# Patient Record
Sex: Female | Born: 1940 | Race: White | Hispanic: No | Marital: Married | Smoking: Never smoker
Health system: Southern US, Community
[De-identification: ages and names within clinical notes are randomized; demographics above are authoritative.]

## PROBLEM LIST (undated history)

## (undated) DIAGNOSIS — M810 Age-related osteoporosis without current pathological fracture: Secondary | ICD-10-CM

## (undated) DIAGNOSIS — I10 Essential (primary) hypertension: Secondary | ICD-10-CM

## (undated) DIAGNOSIS — E079 Disorder of thyroid, unspecified: Secondary | ICD-10-CM

## (undated) DIAGNOSIS — M1990 Unspecified osteoarthritis, unspecified site: Secondary | ICD-10-CM

## (undated) HISTORY — PX: SPINE SURGERY: SHX786

## (undated) HISTORY — DX: Essential (primary) hypertension: I10

## (undated) HISTORY — DX: Unspecified osteoarthritis, unspecified site: M19.90

## (undated) HISTORY — DX: Age-related osteoporosis without current pathological fracture: M81.0

## (undated) HISTORY — PX: FRACTURE SURGERY: SHX138

## (undated) HISTORY — DX: Disorder of thyroid, unspecified: E07.9

## (undated) HISTORY — PX: OTHER SURGICAL HISTORY: SHX169

---

## 2001-07-04 ENCOUNTER — Emergency Department (HOSPITAL_COMMUNITY): Admission: EM | Admit: 2001-07-04 | Discharge: 2001-07-04 | Payer: Self-pay | Admitting: Emergency Medicine

## 2002-10-08 ENCOUNTER — Emergency Department (HOSPITAL_COMMUNITY): Admission: EM | Admit: 2002-10-08 | Discharge: 2002-10-08 | Payer: Self-pay | Admitting: Emergency Medicine

## 2002-10-08 ENCOUNTER — Encounter: Payer: Self-pay | Admitting: *Deleted

## 2008-04-14 ENCOUNTER — Ambulatory Visit (HOSPITAL_COMMUNITY): Admission: RE | Admit: 2008-04-14 | Discharge: 2008-04-14 | Payer: Self-pay | Admitting: Neurosurgery

## 2008-05-05 ENCOUNTER — Inpatient Hospital Stay (HOSPITAL_COMMUNITY): Admission: RE | Admit: 2008-05-05 | Discharge: 2008-05-09 | Payer: Self-pay | Admitting: Neurosurgery

## 2010-08-11 ENCOUNTER — Encounter: Admission: RE | Admit: 2010-08-11 | Discharge: 2010-08-11 | Payer: Self-pay | Admitting: Neurosurgery

## 2010-11-24 ENCOUNTER — Other Ambulatory Visit: Payer: Self-pay | Admitting: Neurosurgery

## 2010-11-24 DIAGNOSIS — M549 Dorsalgia, unspecified: Secondary | ICD-10-CM

## 2010-11-24 DIAGNOSIS — IMO0002 Reserved for concepts with insufficient information to code with codable children: Secondary | ICD-10-CM

## 2010-11-29 ENCOUNTER — Other Ambulatory Visit: Payer: Self-pay

## 2010-12-06 ENCOUNTER — Ambulatory Visit
Admission: RE | Admit: 2010-12-06 | Discharge: 2010-12-06 | Disposition: A | Discharge status: Home / Self Care | Payer: Medicare Other | Source: Ambulatory Visit | Attending: Neurosurgery | Admitting: Neurosurgery

## 2010-12-06 ENCOUNTER — Ambulatory Visit
Admission: RE | Admit: 2010-12-06 | Discharge: 2010-12-06 | Disposition: A | Discharge status: Home / Self Care | Payer: BC Managed Care – PPO | Source: Ambulatory Visit | Attending: Neurosurgery | Admitting: Neurosurgery

## 2010-12-06 ENCOUNTER — Other Ambulatory Visit: Payer: Self-pay

## 2010-12-06 DIAGNOSIS — M549 Dorsalgia, unspecified: Secondary | ICD-10-CM

## 2010-12-06 DIAGNOSIS — IMO0002 Reserved for concepts with insufficient information to code with codable children: Secondary | ICD-10-CM

## 2010-12-27 ENCOUNTER — Other Ambulatory Visit: Payer: Self-pay | Admitting: Neurosurgery

## 2010-12-27 DIAGNOSIS — M479 Spondylosis, unspecified: Secondary | ICD-10-CM

## 2010-12-27 DIAGNOSIS — M549 Dorsalgia, unspecified: Secondary | ICD-10-CM

## 2010-12-27 DIAGNOSIS — M5410 Radiculopathy, site unspecified: Secondary | ICD-10-CM

## 2010-12-28 ENCOUNTER — Ambulatory Visit
Admission: RE | Admit: 2010-12-28 | Discharge: 2010-12-28 | Disposition: A | Discharge status: Home / Self Care | Payer: Medicare Other | Source: Ambulatory Visit | Attending: Neurosurgery | Admitting: Neurosurgery

## 2010-12-28 ENCOUNTER — Other Ambulatory Visit: Payer: Self-pay | Admitting: Neurosurgery

## 2010-12-28 DIAGNOSIS — M5410 Radiculopathy, site unspecified: Secondary | ICD-10-CM

## 2010-12-28 DIAGNOSIS — M479 Spondylosis, unspecified: Secondary | ICD-10-CM

## 2010-12-28 DIAGNOSIS — M549 Dorsalgia, unspecified: Secondary | ICD-10-CM

## 2011-01-11 ENCOUNTER — Ambulatory Visit
Admission: RE | Admit: 2011-01-11 | Discharge: 2011-01-11 | Disposition: A | Discharge status: Home / Self Care | Payer: Medicare Other | Source: Ambulatory Visit | Attending: Neurosurgery | Admitting: Neurosurgery

## 2011-01-11 DIAGNOSIS — M5410 Radiculopathy, site unspecified: Secondary | ICD-10-CM

## 2011-01-11 DIAGNOSIS — M549 Dorsalgia, unspecified: Secondary | ICD-10-CM

## 2011-01-11 DIAGNOSIS — M479 Spondylosis, unspecified: Secondary | ICD-10-CM

## 2011-02-13 ENCOUNTER — Other Ambulatory Visit: Payer: Self-pay | Admitting: Neurosurgery

## 2011-02-13 DIAGNOSIS — M549 Dorsalgia, unspecified: Secondary | ICD-10-CM

## 2011-02-13 DIAGNOSIS — M479 Spondylosis, unspecified: Secondary | ICD-10-CM

## 2011-02-13 DIAGNOSIS — M5410 Radiculopathy, site unspecified: Secondary | ICD-10-CM

## 2011-02-15 ENCOUNTER — Ambulatory Visit
Admission: RE | Admit: 2011-02-15 | Discharge: 2011-02-15 | Disposition: A | Discharge status: Home / Self Care | Payer: Medicare Other | Source: Ambulatory Visit | Attending: Neurosurgery | Admitting: Neurosurgery

## 2011-02-15 DIAGNOSIS — M479 Spondylosis, unspecified: Secondary | ICD-10-CM

## 2011-02-15 DIAGNOSIS — M549 Dorsalgia, unspecified: Secondary | ICD-10-CM

## 2011-02-15 DIAGNOSIS — M5410 Radiculopathy, site unspecified: Secondary | ICD-10-CM

## 2011-02-20 NOTE — Discharge Summary (Signed)
NAMEKENNETHIA, LYNES                ACCOUNT NO.:  [id number]   MEDICAL RECORD NO.:  [id number]          PATIENT TYPE:  INP   LOCATION:  3019                         FACILITY:  [facility]   PHYSICIAN:  Hewitt Shorts, M.D.DATE OF BIRTH:  [birth year]   DATE OF ADMISSION:  05/05/2008  DATE OF DISCHARGE:  05/09/2008                               DISCHARGE SUMMARY   ADMISSION HISTORY AND PHYSICAL EXAMINATION:  The patient is a 70-year-  old woman under the care of Dr. Hilda Lias.  She has had  difficulties with back pain into her lower extremities bilaterally.  She  had L2-L3 spondylolisthesis, stenosis, and radiculopathy.  General  examination was unremarkable.  Neurologic examination was felt by Dr.  Jeral Fruit to show proximal weakness in the lower extremities.  Further  details of the admission history and physical examination are included  in Dr. Cassandria Santee admission note.   HOSPITAL COURSE:  The patient was admitted, underwent L2-L3 lumbar  decompression and fusion by Dr. Jeral Fruit.  She has done well  postoperatively and was seen by Physical Therapy and Occupational  Therapy.  She is up and ambulating in the hall and in fact asking to be  discharged to home.  Her wound is healing nicely, and she is to return  this week for suture removal with Dr. Jeral Fruit.  She was given a  prescription for Flexeril 10 mg q.8 h. p.r.n. muscle spasms.  She has  pain medication at home.   DISCHARGE DIAGNOSES:  1. Lumbar spondylosis.  2. Lumbar degenerative disk disease.  3. Lumbar stenosis.  4. Lumbar radiculopathy.      Hewitt Shorts, M.D.  Electronically Signed     RWN/MEDQ  D:  05/09/2008  T:  05/09/2008  Job:  [id number]

## 2011-02-20 NOTE — H&P (Signed)
NAMETRUST, CRAGO                ACCOUNT NO.:  [id number]   MEDICAL RECORD NO.:  [id number]          PATIENT TYPE:  INP   LOCATION:  3019                         FACILITY:  [facility]   PHYSICIAN:  Hilda Lias, M.D.   DATE OF BIRTH:  25-Dec-1940   DATE OF ADMISSION:  05/05/2008  DATE OF DISCHARGE:                              HISTORY & PHYSICAL   [name] is a lady who came to my office complaining of back pain with  radiation to both knees for several years.  The patient is getting worse  up to the point that the only time she gets relief of pain is when she  sits.  The pain gets worse when she walks.   PAST MEDICAL HISTORY:  Negative.   ALLERGIES:  She is allergic to CODEINE.   SOCIAL HISTORY:  Negative.   REVIEW OF SYSTEMS:  Positive for depression, anxiety, high cholesterol,  high blood pressure.   PHYSICAL EXAMINATION:  The patient came to my office with her husband.  She was walking with short step and immediately she sat and decreased  the pain.  HEAD, EARS, NOSE AND THROAT:  Normal.  NECK:  Normal.  LUNGS:  Clear.  HEART:  Sound normal.  ABDOMEN:  Normal.  EXTREMITIES:  Normal pulses.  NEURO:  Mental status normal.  Cranial nerves normal.  Strength showed  weakness of the iliopsoas and quadriceps with the straight leg raising  being negative, but positive femoral stretch maneuver.   The x-rays showed that he has degenerative disk disease with  spondylolisthesis at the level of L2-3.   IMPRESSION:  L2-L3 spondylolisthesis, stenosis, and radiculopathy.   RECOMMENDATIONS:  The patient will be admitted for surgery.  Procedure  will be L2-L3 diskectomy and interbody fusion with pedicle screws and  cages.  She and her husband knew the risks of infection, CSF leak,  worsening pain, paralysis, and need for surgery.           ______________________________  Hilda Lias, M.D.     EB/MEDQ  D:  05/05/2008  T:  05/06/2008  Job:  [id number]

## 2011-02-20 NOTE — Op Note (Signed)
NAMEJAZMAINE, [name]                ACCOUNT NO.:  [id number]   MEDICAL RECORD NO.:  [id number]          PATIENT TYPE:  INP   LOCATION:  3019                         FACILITY:  [facility]   PHYSICIAN:  Michel Harrow, M.D.     DATE OF BIRTH:  [birth year]   DATE OF PROCEDURE:  05/05/2008  DATE OF DISCHARGE:                               OPERATIVE REPORT   PREOPERATIVE DIAGNOSIS:  L2-L3 degenerative disk disease with stenosis  and chronic radiculopathy.   POSTOPERATIVE DIAGNOSIS:  L2-L3 degenerative disk disease with stenosis  and chronic radiculopathy.   PROCEDURE:  L2-L3 laminectomy, foraminotomy, decompression of the L2-L3  nerve root, in-situ fusion in the right L2-L3, pedicle screws at L2-L3.   SURGEON:  Hilda Lias, MD   ASSISTANT:  Danae Orleans. Venetia Maxon, M.D.   CLINICAL HISTORY:  [name] is a 70 years old female complaining of  back pain radiating to both legs.  X-rays show for degenerative disk  disease with scoliosis at the level of L2-L3.  Surgery was advised.  The  risk was explained to her and her husband.   PROCEDURE IN DETAIL:  The patient was taken to the OR and after  intubation she was positioned in a prone manner.  The skin was prepped  with DuraPrep.  First x-ray showed that we were at the level L1.  We  then identified L2-L3 and we opened in the midline and we retracted  laterally.  The x-ray showed that indeed we were at the L2-L3.  From  then on, we removed the spinous process and lamina of L2-L3 and we did a  bilateral foraminotomy.  We proceeded with removal of facet of L2.  Immediately, we found that the area was quite narrow.  We tried to  introduce a #15 blade in the disc space and we were unable to get into  the space itself.  Because of that, we decided not to proceed with any  interbody fusion.  Then, our attention was to the pedicle screws.  Pedicles were fairly small and we were able to tap with a 4.5 and we  introduced 2 screws at the L2-L3 in the left  side.  Several attempts in  the right side although we were able to get into pedicle itself,  secondary to osteoporosis the pedicle fractured in several locations.  Because of that we decided not doing any pedicle screws in the right  side.  What we did was we removed the periosteum off the lateral facet  of L2-L3 as well as the transverse process and a mix of BMP Mastergraft  and allograft was used for peripheral lateral arthrodesis.  The same  procedure was done in the left side.  Then, a rod was used to connect  the L2-L3 pedicle screws and secured in place with caps.  Then we  investigated again the area and there was plane decompression of the  spine as well as the nerve root.  The area was irrigated.  The  cerebellar __________ was negative.  The wound was closed with Vicryl  and Steri-Strips.  ______________________________  Michel Harrow, M.D.     KB/MEDQ  D:  05/05/2008  T:  05/06/2008  Job:  [id number]

## 2011-06-15 ENCOUNTER — Other Ambulatory Visit (HOSPITAL_COMMUNITY): Payer: Medicare Other

## 2011-07-06 ENCOUNTER — Other Ambulatory Visit (HOSPITAL_COMMUNITY): Payer: Self-pay | Admitting: Neurosurgery

## 2011-07-06 ENCOUNTER — Encounter (HOSPITAL_COMMUNITY)
Admission: RE | Admit: 2011-07-06 | Discharge: 2011-07-06 | Disposition: A | Discharge status: Home / Self Care | Payer: Medicare Other | Source: Ambulatory Visit | Attending: Neurosurgery | Admitting: Neurosurgery

## 2011-07-06 DIAGNOSIS — M48061 Spinal stenosis, lumbar region without neurogenic claudication: Secondary | ICD-10-CM

## 2011-07-06 LAB — DIFFERENTIAL
Basophils Absolute: 0
Basophils Relative: 1
Eosinophils Absolute: 0
Eosinophils Relative: 0
Monocytes Absolute: 0.6
Monocytes Relative: 8

## 2011-07-06 LAB — CBC
HCT: 34.1 % — ABNORMAL LOW (ref 36.0–46.0)
HCT: 43.2
Hemoglobin: 10.5 g/dL — ABNORMAL LOW (ref 12.0–15.0)
Hemoglobin: 14.7
MCHC: 30.8 g/dL (ref 30.0–36.0)
MCHC: 34.2
MCV: 96
MCV: 98.9
Platelets: 233
RBC: 4.1 MIL/uL (ref 3.87–5.11)
RBC: 4.49
RDW: 13.1
WBC: 6.6

## 2011-07-06 LAB — SURGICAL PCR SCREEN
MRSA, PCR: NEGATIVE
Staphylococcus aureus: NEGATIVE

## 2011-07-06 LAB — BASIC METABOLIC PANEL
CO2: 28
CO2: 28 mEq/L (ref 19–32)
Calcium: 9.3 mg/dL (ref 8.4–10.5)
Chloride: 105
GFR calc Af Amer: >60
GFR calc non Af Amer: >60 mL/min (ref >60)
Glucose, Bld: 79
Potassium: 4.2 mEq/L (ref 3.5–5.1)
Sodium: 140
Sodium: 140 mEq/L (ref 135–145)

## 2011-07-06 LAB — POCT I-STAT 4, (NA,K, GLUC, HGB,HCT)
Glucose, Bld: 89
HCT: 32 — ABNORMAL LOW
Potassium: 3.6

## 2011-07-06 LAB — TYPE AND SCREEN

## 2011-07-13 ENCOUNTER — Inpatient Hospital Stay (HOSPITAL_COMMUNITY)
Admission: RE | Admit: 2011-07-13 | Discharge: 2011-07-20 | DRG: 456 | Disposition: A | Discharge status: Home Health | Payer: Medicare Other | Source: Ambulatory Visit | Attending: Neurosurgery | Admitting: Neurosurgery

## 2011-07-13 ENCOUNTER — Inpatient Hospital Stay (HOSPITAL_COMMUNITY): Payer: Medicare Other

## 2011-07-13 DIAGNOSIS — M51379 Other intervertebral disc degeneration, lumbosacral region without mention of lumbar back pain or lower extremity pain: Secondary | ICD-10-CM | POA: Diagnosis present

## 2011-07-13 DIAGNOSIS — Z96619 Presence of unspecified artificial shoulder joint: Secondary | ICD-10-CM

## 2011-07-13 DIAGNOSIS — E7800 Pure hypercholesterolemia, unspecified: Secondary | ICD-10-CM | POA: Diagnosis present

## 2011-07-13 DIAGNOSIS — J9600 Acute respiratory failure, unspecified whether with hypoxia or hypercapnia: Secondary | ICD-10-CM | POA: Diagnosis not present

## 2011-07-13 DIAGNOSIS — I10 Essential (primary) hypertension: Secondary | ICD-10-CM | POA: Diagnosis present

## 2011-07-13 DIAGNOSIS — M4120 Other idiopathic scoliosis, site unspecified: Principal | ICD-10-CM | POA: Diagnosis present

## 2011-07-13 DIAGNOSIS — Y831 Surgical operation with implant of artificial internal device as the cause of abnormal reaction of the patient, or of later complication, without mention of misadventure at the time of the procedure: Secondary | ICD-10-CM | POA: Diagnosis present

## 2011-07-13 DIAGNOSIS — Z01818 Encounter for other preprocedural examination: Secondary | ICD-10-CM

## 2011-07-13 DIAGNOSIS — Z01812 Encounter for preprocedural laboratory examination: Secondary | ICD-10-CM

## 2011-07-13 DIAGNOSIS — M47817 Spondylosis without myelopathy or radiculopathy, lumbosacral region: Secondary | ICD-10-CM | POA: Diagnosis present

## 2011-07-13 DIAGNOSIS — E876 Hypokalemia: Secondary | ICD-10-CM | POA: Diagnosis not present

## 2011-07-13 DIAGNOSIS — F341 Dysthymic disorder: Secondary | ICD-10-CM | POA: Diagnosis present

## 2011-07-13 DIAGNOSIS — R509 Fever, unspecified: Secondary | ICD-10-CM | POA: Diagnosis not present

## 2011-07-13 DIAGNOSIS — D62 Acute posthemorrhagic anemia: Secondary | ICD-10-CM | POA: Diagnosis not present

## 2011-07-13 DIAGNOSIS — Z79899 Other long term (current) drug therapy: Secondary | ICD-10-CM

## 2011-07-13 DIAGNOSIS — T84498A Other mechanical complication of other internal orthopedic devices, implants and grafts, initial encounter: Secondary | ICD-10-CM | POA: Diagnosis present

## 2011-07-13 DIAGNOSIS — I498 Other specified cardiac arrhythmias: Secondary | ICD-10-CM | POA: Diagnosis not present

## 2011-07-13 DIAGNOSIS — M4310 Spondylolisthesis, site unspecified: Secondary | ICD-10-CM | POA: Diagnosis present

## 2011-07-13 DIAGNOSIS — K219 Gastro-esophageal reflux disease without esophagitis: Secondary | ICD-10-CM | POA: Diagnosis present

## 2011-07-13 DIAGNOSIS — M5137 Other intervertebral disc degeneration, lumbosacral region: Secondary | ICD-10-CM | POA: Diagnosis present

## 2011-07-14 LAB — GLUCOSE, CAPILLARY

## 2011-07-15 ENCOUNTER — Inpatient Hospital Stay (HOSPITAL_COMMUNITY): Payer: Medicare Other

## 2011-07-15 LAB — CBC
HCT: 19.8 % — ABNORMAL LOW (ref 36.0–46.0)
Hemoglobin: 6.1 g/dL — CL (ref 12.0–15.0)
MCHC: 30.8 g/dL (ref 30.0–36.0)
MCV: 83.2 fL (ref 78.0–100.0)
RDW: 17.3 % — ABNORMAL HIGH (ref 11.5–15.5)
WBC: 17 10*3/uL — ABNORMAL HIGH (ref 4.0–10.5)

## 2011-07-15 LAB — BASIC METABOLIC PANEL
BUN: 9 mg/dL (ref 6–23)
Chloride: 106 mEq/L (ref 96–112)
Creatinine, Ser: <0.47 mg/dL — ABNORMAL LOW (ref 0.50–1.10)
Glucose, Bld: 131 mg/dL — ABNORMAL HIGH (ref 70–99)
Potassium: 3.9 mEq/L (ref 3.5–5.1)

## 2011-07-16 ENCOUNTER — Inpatient Hospital Stay (HOSPITAL_COMMUNITY): Payer: Medicare Other

## 2011-07-16 DIAGNOSIS — J810 Acute pulmonary edema: Secondary | ICD-10-CM

## 2011-07-16 DIAGNOSIS — R000 Tachycardia, unspecified: Secondary | ICD-10-CM

## 2011-07-16 DIAGNOSIS — J95821 Acute postprocedural respiratory failure: Secondary | ICD-10-CM

## 2011-07-16 DIAGNOSIS — R0902 Hypoxemia: Secondary | ICD-10-CM

## 2011-07-16 LAB — CBC
HCT: 28 % — ABNORMAL LOW (ref 36.0–46.0)
Hemoglobin: 9.1 g/dL — ABNORMAL LOW (ref 12.0–15.0)
MCHC: 32.5 g/dL (ref 30.0–36.0)
RBC: 3.37 MIL/uL — ABNORMAL LOW (ref 3.87–5.11)
WBC: 20.8 10*3/uL — ABNORMAL HIGH (ref 4.0–10.5)

## 2011-07-16 LAB — PROCALCITONIN: Procalcitonin: 0.78 ng/mL

## 2011-07-16 LAB — CROSSMATCH: Unit division: 0

## 2011-07-17 ENCOUNTER — Inpatient Hospital Stay (HOSPITAL_COMMUNITY): Payer: Medicare Other

## 2011-07-17 DIAGNOSIS — J95821 Acute postprocedural respiratory failure: Secondary | ICD-10-CM

## 2011-07-17 DIAGNOSIS — R0902 Hypoxemia: Secondary | ICD-10-CM

## 2011-07-17 DIAGNOSIS — R000 Tachycardia, unspecified: Secondary | ICD-10-CM

## 2011-07-17 DIAGNOSIS — J810 Acute pulmonary edema: Secondary | ICD-10-CM

## 2011-07-17 LAB — BASIC METABOLIC PANEL
BUN: 8 mg/dL (ref 6–23)
CO2: 29 mEq/L (ref 19–32)
Chloride: 104 mEq/L (ref 96–112)
Potassium: 3.3 mEq/L — ABNORMAL LOW (ref 3.5–5.1)

## 2011-07-17 LAB — CBC
HCT: 28.6 % — ABNORMAL LOW (ref 36.0–46.0)
Hemoglobin: 9.2 g/dL — ABNORMAL LOW (ref 12.0–15.0)
RBC: 3.43 MIL/uL — ABNORMAL LOW (ref 3.87–5.11)
RDW: 16.2 % — ABNORMAL HIGH (ref 11.5–15.5)
WBC: 13 10*3/uL — ABNORMAL HIGH (ref 4.0–10.5)

## 2011-07-17 LAB — CARDIAC PANEL(CRET KIN+CKTOT+MB+TROPI)
CK, MB: 2.5 ng/mL (ref 0.3–4.0)
Relative Index: 1.4 (ref 0.0–2.5)
Troponin I: <0.3 ng/mL (ref <0.30)

## 2011-07-18 DIAGNOSIS — Q762 Congenital spondylolisthesis: Secondary | ICD-10-CM

## 2011-07-18 DIAGNOSIS — IMO0002 Reserved for concepts with insufficient information to code with codable children: Secondary | ICD-10-CM

## 2011-07-18 LAB — BASIC METABOLIC PANEL
BUN: 11 mg/dL (ref 6–23)
Creatinine, Ser: <0.47 mg/dL — ABNORMAL LOW (ref 0.50–1.10)
Potassium: 4.8 mEq/L (ref 3.5–5.1)

## 2011-07-19 NOTE — H&P (Signed)
  NAMEDESTINE, Burns                ACCOUNT NO.:  [id number]  MEDICAL RECORD NO.:  [id number]  LOCATION:  2899                         FACILITY:  [facility]  PHYSICIAN:  Hilda Lias, M.D.   DATE OF BIRTH:  01/11/1941  DATE OF ADMISSION:  07/13/2011 DATE OF DISCHARGE:                             HISTORY & PHYSICAL   [name] is a lady who in the past underwent surgery because of degenerative disk disease at the level of L2-L3.  She did well, but lately she had been complaining of more back pain with radiation down to both legs.  She had conservative treatment including physical therapy, pain medication, and she is not any better.  Since the last surgery, the patient developed degenerative spondylolisthesis at the level of L2-L3 and L3-L4 with narrowing of those two level.  She is quite miserable. She had come to my office several occasions and although she had been taking pain medications, she states this is getting worse.  Because offindings, she is being admitted for surgery.  PAST MEDICAL HISTORY:  Lumbar decompression.  SOCIAL HISTORY:  The patient does not smoke or drink.  She is allergic to codeine.  FAMILY HISTORY:  Positive high blood pressure.  REVIEW OF SYSTEMS:  Positive for back pain, leg pain, high cholesterol, high blood pressure, and anxiety.  PHYSICAL EXAMINATION:  GENERAL:  The patient came to my office with her husband.  She had difficulty sitting and standing.  She has immediately started crying because of the pain that is keeping her awake at nighttime. EARS, NOSE, AND, THROAT:  Normal. NECK:  Normal. LUNGS:  Clear. HEART:  Heart sounds normal. ABDOMEN:  Normal. EXTREMITIES:  Normal pulses. NEUROLOGIC:  She has a weakness of the quadriceps and the iliopsoas. She has a decreased flexibility of lumbar spine.  Sensation, she complains of numbness of both legs.  The x-rays showed that she has spondylolisthesis at the level of L1-2, L2-L3, L3-L4.  She has  narrowing of the canal with foraminal stenosis.  CLINICAL IMPRESSION: 1. Degenerative disk disease at L1-2, L2-3, L3-4. 2. Scoliosis.  RECOMMENDATION:  The patient is being admitted for surgery.  The procedure will be laminectomy at the level of L1, L2, L3, L4, interbody fusion with cages.  The procedure would be from __________ level L1-2, L2-3, L3-4.  Pedicle screws from L1 to L4.  Because of the history of questionable osteoarthritis, we will use a bone stimulator.  She and her husband are aware of the risks  of the surgery.  __________ failure of fusion, CSF leak, infection.          ______________________________ Hilda Lias, M.D.     EB/MEDQ  D:  07/13/2011  T:  07/13/2011  Job:  [id number]  Electronically Signed by Hilda Lias M.D. on 07/19/2011 01:12:58 PM

## 2011-07-19 NOTE — Op Note (Signed)
NAMEYOLANDRA, [name]                ACCOUNT NO.:  [id number]  MEDICAL RECORD NO.:  [id number]  LOCATION:  3014                         FACILITY:  [facility]  PHYSICIAN:  Hilda Lias, M.D.   DATE OF BIRTH:  December 14, 1940  DATE OF PROCEDURE: DATE OF DISCHARGE:                              OPERATIVE REPORT   PREOPERATIVE DIAGNOSES:  Status post L2-L3 fusion.  Spondylosis L2-3, L3- 4.  Instability.  Chronic radiculopathy.  Mechanical back pain.  POSTOPERATIVE DIAGNOSES:  Status post L2-L3 fusion.  Spondylosis L2-3, L3-4.  Instability.  Chronic radiculopathy.  Mechanical back pain.  PROCEDURE:  Revision of the epidural fusion.  Removal of the left deltoid pedicle screws.  L3-L4 diskectomy in the right side with decompression of the thecal sac, interbody fusion at the level of L3-L4 with cage, expandable.  Pedicle screws in the left side at that the level L1 and L4 in the right side at the level of L1, L3, and L4. Posterolateral arthrodesis with Vitoss, AlloStem and  autograft.  Cell Saver. C-arm.  SURGEON:  Hilda Lias, MD.  ASSISTANT:  Clydene Fake, M.D.  CLINICAL HISTORY:  [name] is a 69 years old female who in the past underwent fusion at the L2-3.  The patient did well but lately she is having a lot of  pain.  The pain is mostly mechanical every time she walks or bends over,  she develops pain down both legs.  She had failed conservative treatment.  X-rays showed that the pedicle screws at the level of L2-3 on the left side were loose.  She has spondylolisthesis at L2-3, L3-4 which gets worse with flexion,  scoliosis,  Kyphosis. Degenerative disk disease L2-3, L3-4.  Surgery was advised.  The patient understood the surgery including the possibility of CSF leak, infection, hematoma, need for surgery, no improvement whatsoever.  PROCEDURE:  The patient was taken to the OR and after intubation she was positioned in a prone manner.  The back was cleaned with DuraPrep. Drapes  were applied.  The incision from the level of L1 down to L3-4 was made.  Muscle retracted laterally.  Indeed we found that immediately the spinal process of L1 was thickened and we knew that was our main landmark.  We retracted laterally and we found that the screws, although they were in good position in the pedicle, were quite loose.  Because of that, we decided to remove screw at the left L2-3 with removal of the caps.  We started to mobilize the lumbar spine and we found that indeed at the level of L3-4 was the area where she had more mobility.  We proceeded with L3 removal of spinous process, hemilaminectomy and we entered the disk space in the right side.  The total diskectomy from the right side was done all the way down laterally to the midline into the left.  Then a cage expandable was inserted. The initial measurement was 8 x 22 and at the end was 12 x 22.  In the cage, we  had AlloStem . From then on the area was irrigated.  There was no evidence of any more stenosis.  Then using the C-arm in AP  view and then a lateral view we went ahead and put a screw at the level of L1 and L4 in the left side. We decided because of the narrow pedicle screws  it would be a big mistake to try to introduce anything bigger than 5.5.  The 5.5 was loose and we were really worried about using a 6.5.  In the right side we placed screws at the level of L1, L3 and L4.  The screw were connected with a rod and cap.  Cross link from right and left  was used.  Then we went laterally and we removed the periosteum of the lateral aspect of the facet at L1-2, L2-3, L3-4 well as well as the transverse process.  A mix of Vitoss, AlloStem and autograft was used for arthrodesis.  Then, because the patient has a history of fusion  in the past, we made a pocket in the subcutaneous space and we put a bone  stimulator in the area for arthrodesis.  Valsalva maneuver was negative.  At the end, we used Tisseel in the  area where we did the diskectomy just to  prevent any possibility of CSF leak.  The patient had quite a bit of scar tissue.  Then after the area was irrigated.  It was closed with Vicryl and staples.  The patient is going to go to the recovery room.          ______________________________ Hilda Lias, M.D.     EB/MEDQ  D:  07/13/2011  T:  07/13/2011  Job:  [id number]  Electronically Signed by Hilda Lias M.D. on 07/19/2011 01:13:01 PM

## 2011-07-23 ENCOUNTER — Inpatient Hospital Stay (HOSPITAL_COMMUNITY): Payer: Medicare Other

## 2011-07-23 ENCOUNTER — Inpatient Hospital Stay (HOSPITAL_COMMUNITY)
Admission: AD | Admit: 2011-07-23 | Discharge: 2011-08-01 | DRG: 862 | Disposition: A | Discharge status: Home Health | Payer: Medicare Other | Source: Ambulatory Visit | Attending: Neurosurgery | Admitting: Neurosurgery

## 2011-07-23 DIAGNOSIS — R079 Chest pain, unspecified: Secondary | ICD-10-CM | POA: Diagnosis not present

## 2011-07-23 DIAGNOSIS — A4902 Methicillin resistant Staphylococcus aureus infection, unspecified site: Secondary | ICD-10-CM | POA: Diagnosis present

## 2011-07-23 DIAGNOSIS — I10 Essential (primary) hypertension: Secondary | ICD-10-CM | POA: Diagnosis present

## 2011-07-23 DIAGNOSIS — Z981 Arthrodesis status: Secondary | ICD-10-CM

## 2011-07-23 DIAGNOSIS — T8140XA Infection following a procedure, unspecified, initial encounter: Principal | ICD-10-CM | POA: Diagnosis present

## 2011-07-23 DIAGNOSIS — N390 Urinary tract infection, site not specified: Secondary | ICD-10-CM | POA: Diagnosis not present

## 2011-07-23 DIAGNOSIS — Y838 Other surgical procedures as the cause of abnormal reaction of the patient, or of later complication, without mention of misadventure at the time of the procedure: Secondary | ICD-10-CM | POA: Diagnosis present

## 2011-07-23 DIAGNOSIS — M519 Unspecified thoracic, thoracolumbar and lumbosacral intervertebral disc disorder: Secondary | ICD-10-CM | POA: Diagnosis present

## 2011-07-23 DIAGNOSIS — D649 Anemia, unspecified: Secondary | ICD-10-CM | POA: Diagnosis present

## 2011-07-23 DIAGNOSIS — K219 Gastro-esophageal reflux disease without esophagitis: Secondary | ICD-10-CM | POA: Diagnosis not present

## 2011-07-23 DIAGNOSIS — K6812 Psoas muscle abscess: Secondary | ICD-10-CM | POA: Diagnosis present

## 2011-07-23 LAB — DIFFERENTIAL
Basophils Absolute: 0 10*3/uL (ref 0.0–0.1)
Lymphs Abs: 1.5 10*3/uL (ref 0.7–4.0)
Monocytes Absolute: 1.8 10*3/uL — ABNORMAL HIGH (ref 0.1–1.0)
Monocytes Relative: 5 % (ref 3–12)

## 2011-07-23 LAB — CBC
MCH: 27.5 pg (ref 26.0–34.0)
MCHC: 33.3 g/dL (ref 30.0–36.0)
Platelets: 869 10*3/uL — ABNORMAL HIGH (ref 150–400)

## 2011-07-23 LAB — COMPREHENSIVE METABOLIC PANEL
ALT: 12 U/L (ref 0–35)
AST: 13 U/L (ref 0–37)
Calcium: 8.5 mg/dL (ref 8.4–10.5)
Sodium: 129 mEq/L — ABNORMAL LOW (ref 135–145)
Total Protein: 5.6 g/dL — ABNORMAL LOW (ref 6.0–8.3)

## 2011-07-23 NOTE — Discharge Summary (Signed)
  NAMEELIE, GRAGERT                ACCOUNT NO.:  [id number]  MEDICAL RECORD NO.:  [id number]  LOCATION:  3032                         FACILITY:  [facility]  PHYSICIAN:  Hilda Lias, M.D.   DATE OF BIRTH:  01-Nov-1940  DATE OF ADMISSION:  07/13/2011 DATE OF DISCHARGE:  07/20/2011                              DISCHARGE SUMMARY   ADMISSION DIAGNOSIS:  Degenerative disk disease, L1-L2, L2-L3, L3-L4 with scoliosis.  FINAL DIAGNOSES:  Degenerative disk disease, L1-L2, L2-L3, L3-L4 with scoliosis, plus acute anemia postoperatively.  CLINICAL HISTORY:  Ms. Siebel is a 70 year old 1lady who in the past underwent fusion at L2-3.  I have been followed her in my office for many years.  Lately, she is going to having more pain.  X-rays showed that she had worsening scoliosis with degenerative disk disease from L1 down to L4.  In view of no improvement, surgery was advised.  LABORATORY:  At the present time, within normal limits.  COURSE IN THE HOSPITAL:  The patient was taken to surgery on October 5 and fusion from L1-L4 was done using pedicle screws as well as autograft and Vitoss.  After surgery, the patient had quite a bit of headache. She had difficulty breathing and she has acute postoperative anemia. She was transferred to the ICU.  She was seen by the Critical Care Service.  She was given 2 units of blood.  Eventually, the patient became stable.  She was transferred back to the regular floor.  Today, she is ambulating.  She had minimal discomfort with the lower back.  She had no leg pain.  She is ready to go home.  Although she was seen by the rehab center since they felt that she was doing really well, there was no point for her to be admitted to the hospital.  CONDITION ON DISCHARGE:  Stable.  MEDICATIONS:  Hydrocodone and Flexeril.  DIET:  Regular.  ACTIVITY:  Not to drive until I see her.  FOLLOWUP:  She will be seen by me in 10 days.  The patient is going to have home  health physical therapy.         ______________________________ Hilda Lias, M.D.    EB/MEDQ  D:  07/20/2011  T:  07/20/2011  Job:  [id number]  Electronically Signed by Hilda Lias M.D. on 07/23/2011 03:18:33 PM

## 2011-07-24 DIAGNOSIS — Y849 Medical procedure, unspecified as the cause of abnormal reaction of the patient, or of later complication, without mention of misadventure at the time of the procedure: Secondary | ICD-10-CM

## 2011-07-24 DIAGNOSIS — T8140XA Infection following a procedure, unspecified, initial encounter: Secondary | ICD-10-CM

## 2011-07-24 LAB — GRAM STAIN

## 2011-07-24 LAB — URINALYSIS, ROUTINE W REFLEX MICROSCOPIC
Glucose, UA: NEGATIVE mg/dL
Hgb urine dipstick: NEGATIVE
Specific Gravity, Urine: 1.01 (ref 1.005–1.030)
Urobilinogen, UA: 0.2 mg/dL (ref 0.0–1.0)

## 2011-07-24 LAB — URINE MICROSCOPIC-ADD ON

## 2011-07-24 LAB — WOUND CULTURE

## 2011-07-25 LAB — URINE CULTURE
Colony Count: >=100000
Culture  Setup Time: 201210160516

## 2011-07-26 LAB — CULTURE, BLOOD (ROUTINE X 2): Culture  Setup Time: 201210160217

## 2011-07-27 DIAGNOSIS — T8140XA Infection following a procedure, unspecified, initial encounter: Secondary | ICD-10-CM

## 2011-07-27 DIAGNOSIS — Y839 Surgical procedure, unspecified as the cause of abnormal reaction of the patient, or of later complication, without mention of misadventure at the time of the procedure: Secondary | ICD-10-CM

## 2011-07-27 LAB — WOUND CULTURE

## 2011-07-27 LAB — VANCOMYCIN, TROUGH: Vancomycin Tr: 5.9 ug/mL — ABNORMAL LOW (ref 10.0–20.0)

## 2011-07-28 LAB — DIFFERENTIAL
Basophils Absolute: 0.1 10*3/uL (ref 0.0–0.1)
Basophils Relative: 1 % (ref 0–1)
Eosinophils Absolute: 0.1 10*3/uL (ref 0.0–0.7)
Eosinophils Relative: 1 % (ref 0–5)
Monocytes Absolute: 0.9 10*3/uL (ref 0.1–1.0)

## 2011-07-28 LAB — CBC
HCT: 22 % — ABNORMAL LOW (ref 36.0–46.0)
MCHC: 32.7 g/dL (ref 30.0–36.0)
Platelets: 1122 10*3/uL (ref 150–400)
RDW: 17.4 % — ABNORMAL HIGH (ref 11.5–15.5)

## 2011-07-28 LAB — RETICULOCYTES
RBC.: 2.99 MIL/uL — ABNORMAL LOW (ref 3.87–5.11)
Retic Count, Absolute: 23.9 10*3/uL (ref 19.0–186.0)

## 2011-07-28 LAB — BASIC METABOLIC PANEL
BUN: 3 mg/dL — ABNORMAL LOW (ref 6–23)
GFR calc Af Amer: >90 mL/min (ref >90)
GFR calc non Af Amer: >90 mL/min (ref >90)
Potassium: 2.9 mEq/L — ABNORMAL LOW (ref 3.5–5.1)

## 2011-07-28 LAB — OCCULT BLOOD X 1 CARD TO LAB, STOOL
Fecal Occult Bld: NEGATIVE
Fecal Occult Bld: NEGATIVE

## 2011-07-29 LAB — ANAEROBIC CULTURE

## 2011-07-29 LAB — URINALYSIS, ROUTINE W REFLEX MICROSCOPIC
Glucose, UA: NEGATIVE mg/dL
Hgb urine dipstick: NEGATIVE
Ketones, ur: 15 mg/dL — AB
Nitrite: POSITIVE — AB
Protein, ur: NEGATIVE mg/dL
Specific Gravity, Urine: 1.014 (ref 1.005–1.030)
Urobilinogen, UA: 0.2 mg/dL (ref 0.0–1.0)
pH: 6 (ref 5.0–8.0)

## 2011-07-29 LAB — DIFFERENTIAL
Basophils Absolute: 0.1 10*3/uL (ref 0.0–0.1)
Basophils Relative: 1 % (ref 0–1)
Eosinophils Absolute: 0.2 10*3/uL (ref 0.0–0.7)
Eosinophils Relative: 2 % (ref 0–5)
Lymphocytes Relative: 12 % (ref 12–46)
Lymphs Abs: 1.3 10*3/uL (ref 0.7–4.0)
Monocytes Absolute: 0.6 10*3/uL (ref 0.1–1.0)
Monocytes Relative: 5 % (ref 3–12)
Neutro Abs: 8.8 10*3/uL — ABNORMAL HIGH (ref 1.7–7.7)
Neutrophils Relative %: 81 % — ABNORMAL HIGH (ref 43–77)

## 2011-07-29 LAB — CBC
HCT: 22 % — ABNORMAL LOW (ref 36.0–46.0)
Hemoglobin: 7.1 g/dL — ABNORMAL LOW (ref 12.0–15.0)
MCH: 26.5 pg (ref 26.0–34.0)
MCHC: 32.3 g/dL (ref 30.0–36.0)
MCV: 82.1 fL (ref 78.0–100.0)
Platelets: 1027 10*3/uL (ref 150–400)
RBC: 2.68 MIL/uL — ABNORMAL LOW (ref 3.87–5.11)
RDW: 17.3 % — ABNORMAL HIGH (ref 11.5–15.5)
WBC: 10.9 10*3/uL — ABNORMAL HIGH (ref 4.0–10.5)

## 2011-07-29 LAB — BASIC METABOLIC PANEL
BUN: 3 mg/dL — ABNORMAL LOW (ref 6–23)
CO2: 24 mEq/L (ref 19–32)
Calcium: 7 mg/dL — ABNORMAL LOW (ref 8.4–10.5)
Chloride: 110 mEq/L (ref 96–112)
Creatinine, Ser: 0.45 mg/dL — ABNORMAL LOW (ref 0.50–1.10)
GFR calc Af Amer: >90 mL/min (ref >90)
GFR calc non Af Amer: >90 mL/min (ref >90)
Glucose, Bld: 99 mg/dL (ref 70–99)
Potassium: 4.9 mEq/L (ref 3.5–5.1)
Sodium: 139 mEq/L (ref 135–145)

## 2011-07-29 LAB — VANCOMYCIN, TROUGH: Vancomycin Tr: 29.7 ug/mL (ref 10.0–20.0)

## 2011-07-29 LAB — URINE MICROSCOPIC-ADD ON

## 2011-07-30 ENCOUNTER — Inpatient Hospital Stay (HOSPITAL_COMMUNITY): Payer: Medicare Other

## 2011-07-30 LAB — URINE CULTURE
Colony Count: 7000
Culture  Setup Time: 201210220059

## 2011-07-30 LAB — BASIC METABOLIC PANEL
BUN: <3 mg/dL — ABNORMAL LOW (ref 6–23)
CO2: 23 mEq/L (ref 19–32)
Calcium: 6.9 mg/dL — ABNORMAL LOW (ref 8.4–10.5)
Chloride: 108 mEq/L (ref 96–112)
Creatinine, Ser: 0.45 mg/dL — ABNORMAL LOW (ref 0.50–1.10)
GFR calc Af Amer: >90 mL/min (ref >90)
GFR calc non Af Amer: >90 mL/min (ref >90)
Glucose, Bld: 83 mg/dL (ref 70–99)
Potassium: 3.7 mEq/L (ref 3.5–5.1)
Sodium: 137 mEq/L (ref 135–145)

## 2011-07-30 LAB — PATHOLOGIST SMEAR REVIEW

## 2011-07-30 LAB — CBC
HCT: 21.7 % — ABNORMAL LOW (ref 36.0–46.0)
Hemoglobin: 7.1 g/dL — ABNORMAL LOW (ref 12.0–15.0)
MCH: 27.1 pg (ref 26.0–34.0)
MCHC: 32.7 g/dL (ref 30.0–36.0)
MCV: 82.8 fL (ref 78.0–100.0)
Platelets: 972 10*3/uL (ref 150–400)
RBC: 2.62 MIL/uL — ABNORMAL LOW (ref 3.87–5.11)
RDW: 17.7 % — ABNORMAL HIGH (ref 11.5–15.5)
WBC: 8.5 10*3/uL (ref 4.0–10.5)

## 2011-07-30 LAB — CARDIAC PANEL(CRET KIN+CKTOT+MB+TROPI)
CK, MB: 2.6 ng/mL (ref 0.3–4.0)
CK, MB: 2.8 ng/mL (ref 0.3–4.0)
Relative Index: INVALID (ref 0.0–2.5)
Total CK: 56 U/L (ref 7–177)
Total CK: 60 U/L (ref 7–177)
Troponin I: <0.3 ng/mL (ref <0.30)
Troponin I: <0.3 ng/mL (ref <0.30)

## 2011-07-30 LAB — LIPID PANEL
LDL Cholesterol: 50 mg/dL (ref 0–99)
VLDL: 19 mg/dL (ref 0–40)

## 2011-07-30 LAB — GLUCOSE, CAPILLARY: Glucose-Capillary: 92 mg/dL (ref 70–99)

## 2011-07-31 LAB — CARDIAC PANEL(CRET KIN+CKTOT+MB+TROPI): Total CK: 29 U/L (ref 7–177)

## 2011-07-31 LAB — HEMOGLOBIN A1C
Hgb A1c MFr Bld: 6.5 % — ABNORMAL HIGH (ref <5.7)
Mean Plasma Glucose: 140 mg/dL — ABNORMAL HIGH (ref <117)

## 2011-07-31 NOTE — H&P (Signed)
  NAMEJIMENA, [name]                ACCOUNT NO.:  [id number]  MEDICAL RECORD NO.:  [id number]  LOCATION:  3039                         FACILITY:  [facility]  PHYSICIAN:  Hilda Lias, M.D.   DATE OF BIRTH:  1941-05-31  DATE OF ADMISSION:  07/23/2011 DATE OF DISCHARGE:                             HISTORY & PHYSICAL   HISTORY OF PRESENT ILLNESS:  [name] is a lady who underwent fusion from L2-L4 because of degenerative disk disease and scoliosis.  The patient did well.  She was almost pain-free.  She went home on date of surgery with no incisional pain.  Her husband called over the weekend complaining that the temperature was going up to 103 without any evidence of headaches.  He called over the weekend and he called me again early this morning.  I talked to him and because of that I brought her immediately to the hospital.  PAST MEDICAL HISTORY:  Lumbar decompression with fusion.  SOCIAL HISTORY:  The patient does not smoke or drink.  ALLERGIES:  She is allergic to CODEINE.  FAMILY HISTORY:  Positive for high blood pressure.  REVIEW OF SYSTEMS:  Positive for temperature up to 103, back pain, high cholesterol.  PHYSICAL EXAMINATION:  GENERAL:  The patient is having quite bit of discomfort in the lumbar thoracic area.  The wound is well healed. There is some minimal redness, but no drainage. NEUROLOGIC:  Mentally, she is oriented x3.  The strength is normal in the upper and lower extremities. LUNGS:  Clear. HEART:  Sounds normal. ABDOMEN:  normal. EXTREMITIES:  Normal pulses.  CLINICAL IMPRESSION:  Temperature 10 days post surgery.  RECOMMENDATIONS:  I talked to her and her husband.  We are going to admit her to the hospital.  We are going to give her IV fluids and we are going to do a workup and try to find out where the infection is coming from.  She will be getting a CBC with differential, blood culture x2, a urine culture, sputum, and chest x-ray.  I will follow Ms.  Burns and later on I will see if indeed we need to get involve the Infectious Disease.          ______________________________ Hilda Lias, M.D.     EB/MEDQ  D:  07/23/2011  T:  07/24/2011  Job:  [id number]  Electronically Signed by Hilda Lias M.D. on 07/31/2011 11:38:44 AM

## 2011-07-31 NOTE — Op Note (Signed)
  [name], [name]                ACCOUNT NO.:  [id number]  MEDICAL RECORD NO.:  [id number]  LOCATION:  3039                         FACILITY:  [facility]  PHYSICIAN:  Hilda Lias, M.D.   DATE OF BIRTH:  09-19-1941  DATE OF PROCEDURE:  07/24/2011 DATE OF DISCHARGE:                              OPERATIVE REPORT   PREOPERATIVE DIAGNOSIS:  Rule out lumbar wound infection.  POSTOPERATIVE DIAGNOSIS:  Lumbar wound infection.  PROCEDURE:  Incision and drainage of lumbar wound.  SURGEON:  Hilda Lias, MD.  CLINICAL HISTORY:  [name] is a 70 year old female who underwent fusion from L1-L4 about 2 weeks ago.  The patient had some difficulty breathing at the beginning, had some headache.  There was a question of spinal fluid leak.  Nevertheless, the patient was discharged 8 days from the hospital with no headache.  The wound was normal and there was no evidence of any fever.  During the hospitalization, she Had been treated __abs________.  Nevertheless, she called over the weekend because of the increase of symptoms.  After her reaching to the hospital, we did a CT scan of the spine and because of the findings, she is being taken for surgery.  The CT scan showed some gas in the lumbar wound with some swelling of the dry soft muscle.  PROCEDURE IN DETAIL:  The patient was taken to the OR and after intubation, she was positioned in a prone manner.  The staples were removed.  There was some redness, but no evidence of any drainage.  The skin was cleaned with DuraPrep.  Drapes were applied.  After we removed the staples, we made the incision in the midline.  We went through the subcutaneous tissue and once we opened the fascia, we found some purulent material.  The tissue was sent immediately to the lab for culture and sensitivity as well as Gram.  Then using about 300 mL of saline solution, we irrigated the incision; superior, inferior, and lateral.  There was no evidence of any  necrotic tissue.  Valsalva maneuver up to 50 mmHg was negative.  After __________ cleaning of the wound, the wound was closed loosely with a Vicryl and Steri-Strips.  She has been advised on precaution, and we are going to obtain a consult with Infectious Disease.          ______________________________ Hilda Lias, M.D.     EB/MEDQ  D:  07/24/2011  T:  07/25/2011  Job:  [id number]  Electronically Signed by Hilda Lias M.D. on 07/31/2011 11:39:13 AM

## 2011-08-01 ENCOUNTER — Inpatient Hospital Stay (HOSPITAL_COMMUNITY): Payer: Medicare Other

## 2011-08-01 LAB — CBC
HCT: 21.5 % — ABNORMAL LOW (ref 36.0–46.0)
MCH: 26.4 pg (ref 26.0–34.0)
MCV: 81.1 fL (ref 78.0–100.0)
RDW: 17.6 % — ABNORMAL HIGH (ref 11.5–15.5)
WBC: 8.6 10*3/uL (ref 4.0–10.5)

## 2011-08-02 LAB — CULTURE, BLOOD (ROUTINE X 2): Culture  Setup Time: 201210192113

## 2011-08-03 LAB — CULTURE, BLOOD (ROUTINE X 2)

## 2011-08-03 NOTE — Consult Note (Signed)
NAMEBOBBY, [name]                ACCOUNT NO.:  [id number]  MEDICAL RECORD NO.:  [id number]  LOCATION:  3039                         FACILITY:  [facility]  PHYSICIAN:  Debbora Presto, MD DATE OF BIRTH:  10/22/1940  DATE OF CONSULTATION:  07/30/2011 DATE OF DISCHARGE:                                CONSULTATION   CHIEF CONCERN:  Chest pain.  HISTORY OF PRESENT ILLNESS:  The patient is a 70 year old female with medical history outlined below who is now status post I and D of lumbar wound postop day 6 and now presents with acute onset of substernal chest pain, 5/10 in severity, intermittent, nonradiating, worse when lying down.  No specific alleviating factors.  The patient reports similar episodes of pain in the past and reports having occasional problems with acid reflux.  She denies fevers and chills, cough, no abdominal or urinary concerns at this time.  PAST MEDICAL HISTORY: 1. Degenerative disk disease, L1-L2, L2-L3, L3-L4 with scoliosis. 2. Acute anemia postop and now status post revision of epidural fusion     July 14, 2011. 3. Hypertension. 4. Hyperlipidemia.  FAMILY HISTORY:  Noncontributory.  SOCIAL HISTORY:  Denies alcohol, drugs, or tobacco use.  ALLERGIES:  CODEINE gives a rash.  MEDICATIONS IN HOSPITAL:  Norvasc, Lipitor, Restasis, Colace, Lovenox for DVT prophylaxis, milk of magnesia, Protonix, iron, rifampin, vancomycin, Xanax, and Vicodin p.r.n.  REVIEW OF SYSTEMS:  Per HPI.  PHYSICAL EXAMINATION:  VITAL SIGNS:  Temperature 98.1, pulse 94, 206 beats per minute, respirations 18, blood pressure 109/62, oxygen saturation 94% on room air. GENERAL:  Lying in bed, not in acute distress. HEENT:  Head atraumatic.  PERRLA.  EOMI.  No oropharyngeal erythema. NECK:  Supple.  Full range of motion.  No thyromegaly. HEART:  Regular rhythm, but slightly tachycardic, no murmurs, rubs, or gallops, S1 and S2 present, no JVD. ABDOMEN:  Soft, nontender, nondistended.   Bowel sounds present. GU:  No costovertebral angle tenderness. EXTREMITIES:  No edema. SKIN:  No rashes, normal turgor. NEUROLOGIC:  Grossly nonfocal.  LABORATORY DATA:  Sodium 137, potassium 3.7, chloride 108, bicarb 23, BUN less than 3, creatinine 0.45, and glucose 83.  WBC 8.5, hemoglobin 7.1, platelets 972.  ASSESSMENT AND PLAN: 1. Chest pain, unclear etiology; however, the patient has several risk     factors including hypertension, hyperlipidemia.  We will observe     the patient on telemetry for 24 hours.  We will cycle cardiac     enzymes x3, and we will obtain first set now.  We will also obtain     12-lead EKG, check TSH, fasting lipid panel, and A1c for risk     stratification.  Recommend starting the patient on aspirin 81 mg     p.o. daily.  Also recommend continuing blood pressure control and     cholesterol control. 2. Anemia, this is reported to be secondary to acute blood loss     postoperatively.  Currently hemoglobin remained stable at around 7.     The patient is currently on iron tablets, transfuse if hemoglobin     drops below 7. 3. Diskitis - MRSA.  The patient will require total 6 weeks of  vancomycin and rifampin.  Cultures will have to be repeated to     assure clearance.  Continue to follow WBCs for now continue     recommendations.  Recommended antibiotics. 4. Urinary tract infection - therapy has been completed.  UTI     resolved.  Over 30 minutes spent on consulting the patient.     Debbora Presto, MD     IM/MEDQ  D:  07/31/2011  T:  07/31/2011  Job:  [id number]  Electronically Signed by Debbora Presto MD on 08/03/2011 03:07:49 PM

## 2011-08-07 NOTE — Discharge Summary (Signed)
  NAMECANIYAH, [name]                ACCOUNT NO.:  [id number]  MEDICAL RECORD NO.:  [id number]  LOCATION:  3039                         FACILITY:  [facility]  PHYSICIAN:  Hilda Lias, M.D.   DATE OF BIRTH:  July 15, 1941  DATE OF ADMISSION:  07/23/2011 DATE OF DISCHARGE:  08/01/2011                              DISCHARGE SUMMARY   ADMISSION DIAGNOSES: 1. Lumbar wound infection. 2. Methicillin-resistant staphylococcus aureus positive, status post     fusion at L1-L5.  FINAL DIAGNOSES: 1. Lumbar wound infection. 2. Methicillin-Resistant Staphylococcus aureus positive, status post     fusion at L1-L5.  CLINICAL HISTORY:  [name] is a lady who was readmitted to the hospital after she underwent fusion from L1-L4 secondary to degenerative disk disease and scoliosis.  By the time, she was discharged, she was afebrile for at least 3 days.  She called to tell me that for 2 days, she had been running temperatures up to 103.  Because of that reason, she was advised to come immediately to [facility].  LABORATORY DATA:  A culture grew MRSA.  Her white cell is 10.9 from 35,000.  Sedimentation rate of 6.  Serum iron of less than 10.  COURSE IN THE HOSPITAL:  The patient was taken to Surgery on July 24, 2011, for incision and drainage for the lumbar wound was done.  The patient was seen by Infectious Disease and she is at the present time taking vancomycin as well as rifampin.  She was complaining of some chest pain.  She was seen by hospitalist the workup for any cardiovascular problem was negative.  He was found that she was quite anemic.  She had been unable to take any iron because of nausea.  Today, she had been afebrile for at least 6 days.  Although, she is still draining from the wound, nevertheless is less.  She is eager to go home. I talked to her husband at length as well as her daughter.  She is going to be discharged, to be followed by me in 3 weeks and also  we are going to make arrangement for her to have a nurse to continue with the antibiotics.  Followup later with Infectious Disease.  Although, she has an appointment to see me in 3 weeks.  Nevertheless, __________ fully aware that if there is any question, he is to call me any time.  ACTIVITY:  Any kind of activity, except no bending.  DIET:  Regular, although she was encouraged to continue taking iron and a high-protein diet.          ______________________________ Hilda Lias, M.D.     EB/MEDQ  D:  08/01/2011  T:  08/01/2011  Job:  [id number]  Electronically Signed by Hilda Lias M.D. on 08/07/2011 08:12:07 AM

## 2011-08-20 ENCOUNTER — Encounter: Payer: Self-pay | Admitting: Internal Medicine

## 2011-08-20 ENCOUNTER — Ambulatory Visit (INDEPENDENT_AMBULATORY_CARE_PROVIDER_SITE_OTHER): Payer: Medicare Other | Admitting: Internal Medicine

## 2011-08-20 VITALS — BP 135/79 | HR 102 | Temp 97.7°F | Ht <= 58 in | Wt 105.0 lb

## 2011-08-20 DIAGNOSIS — M4640 Discitis, unspecified, site unspecified: Secondary | ICD-10-CM | POA: Insufficient documentation

## 2011-08-20 DIAGNOSIS — M519 Unspecified thoracic, thoracolumbar and lumbosacral intervertebral disc disorder: Secondary | ICD-10-CM

## 2011-08-20 MED ORDER — CLINDAMYCIN HCL 300 MG PO CAPS: 300.0000 mg | ORAL_CAPSULE | Freq: Three times a day (TID) | ORAL | Status: AC

## 2011-08-20 NOTE — Assessment & Plan Note (Signed)
She does have some complaints of vancomycin she seems to be tolerating an overall very well. Her most recent creatinine was 0.5 and WBC of 9.6 as well as elevated platelets over thousand. I'm concerned though that she had recent drainage until recently that it is a slow healing infection. She is due to complete her antibiotic course about 2 weeks time however I am going to check an ESR and CRP to see if that has trended down or perhaps she needs to continued IV therapy or as an alternative p.o. Therapy following the completion of her therapy with vancomycin at the end of the month. I will await the results of the CRP and ESR before making a decision. The patient is anxious to complete the antibiotics and even continuation of p.o. Antibiotics made her a bit anxious however I am hesitant to stop antibiotics if her inflammatory markers did not improve. Otherwise she seems to be tolerating the medicine well including her creatinine and no other allergic type reactions.

## 2011-08-20 NOTE — Patient Instructions (Signed)
Start the Bactrim after completing the vancomycin

## 2011-08-20 NOTE — Progress Notes (Signed)
  Subjective:    Patient ID: [name], female    DOB: 03/11/1941, 70 y.o.   MRN: [id number]  HPIthe patient is in today for follow up on hospitalization for vertebral infection.she does have a history of L1-L5 vertebral fusion and now with a MRSA infection in the area. She was started on a regimen of vancomycin IV and now is approximately one month into her course. She does tell me that she's had some drainage of until about 4 or 5 days ago and now has completely stopped and she did have an adverse tolerance to rifampin which was stopped as well last week. Otherwise she continues to take the vancomycin though she does say she had some lower extremity swelling that she attributes to the vancomycin and otherwise appears anxious due to this therapy. She has no other specific complaints including no fever or chills. She has noted no rashes and has had no diarrhea. She's had no weight loss or period    Review of Systems  Constitutional: Negative for fever, chills, activity change, appetite change and fatigue.  Respiratory: Negative for shortness of breath.   Cardiovascular: Positive for leg swelling. Negative for chest pain.  Gastrointestinal: Negative for nausea, vomiting, diarrhea and rectal pain.  Genitourinary: Negative for dysuria, urgency and hematuria.  Skin: Negative for pallor and rash.  Neurological: Negative for headaches.  Psychiatric/Behavioral: Negative for dysphoric mood.       Objective:   Physical Exam  Constitutional: She appears well-developed and well-nourished. No distress.  HENT:  Mouth/Throat: Oropharynx is clear and moist. No oropharyngeal exudate.  Cardiovascular: Normal rate, regular rhythm and normal heart sounds.   No murmur heard. Pulmonary/Chest: Effort normal and breath sounds normal. No respiratory distress. She has no wheezes.  Abdominal: Soft. Bowel sounds are normal. There is no tenderness.  Lymphadenopathy:    She has no cervical adenopathy.  Skin:     Incision on lumbar region closed, some erythema, no warmth and no drainage.           Assessment & Plan:

## 2011-08-22 ENCOUNTER — Telehealth: Payer: Self-pay | Admitting: *Deleted

## 2011-08-22 NOTE — Telephone Encounter (Signed)
Christy a nurse from [facility] called [phone]) asking for an order for cathflo. I called the [facility] pharmacy & gave that order-- per D. Esteridge, Charity fundraiser. Called Christy back. I had to leave her a message.

## 2011-08-23 ENCOUNTER — Other Ambulatory Visit: Payer: Self-pay | Admitting: Internal Medicine

## 2011-08-23 ENCOUNTER — Telehealth: Payer: Self-pay | Admitting: *Deleted

## 2011-08-23 DIAGNOSIS — M4640 Discitis, unspecified, site unspecified: Secondary | ICD-10-CM

## 2011-08-23 NOTE — Telephone Encounter (Signed)
She had left a message on the answering machine. Bad connection. I think she asked for an appt. I called her back & left her a message

## 2011-08-24 ENCOUNTER — Other Ambulatory Visit (HOSPITAL_COMMUNITY): Payer: Medicare Other

## 2011-08-27 ENCOUNTER — Ambulatory Visit (HOSPITAL_COMMUNITY)
Admission: RE | Admit: 2011-08-27 | Discharge: 2011-08-27 | Disposition: A | Discharge status: Home / Self Care | Payer: Medicare Other | Source: Ambulatory Visit | Attending: Internal Medicine | Admitting: Internal Medicine

## 2011-08-27 ENCOUNTER — Encounter (HOSPITAL_COMMUNITY): Payer: Self-pay

## 2011-08-27 DIAGNOSIS — M549 Dorsalgia, unspecified: Secondary | ICD-10-CM | POA: Insufficient documentation

## 2011-08-27 DIAGNOSIS — M4640 Discitis, unspecified, site unspecified: Secondary | ICD-10-CM

## 2011-08-27 MED ORDER — IOHEXOL 300 MG/ML  SOLN
80.0000 mL | Freq: Once | INTRAMUSCULAR | Status: AC | PRN
  Administered 2011-08-27: 80 mL via INTRAVENOUS

## 2011-08-29 ENCOUNTER — Telehealth: Payer: Self-pay | Admitting: *Deleted

## 2011-08-29 NOTE — Telephone Encounter (Signed)
Recent CT results shared with pt.  Pt w/o complaints at present.  Pleased with results.  Verbalized understanding.  Has f/u appt in Jan. 2013 w/ Dr. Luciana Axe.

## 2011-09-03 ENCOUNTER — Telehealth: Payer: Self-pay | Admitting: *Deleted

## 2011-09-03 NOTE — Telephone Encounter (Signed)
Spoke with Christy at [facility] and verbal order given to extend IV antibiotics for 2 weeks. She will then start the Cleocin and continue until appointment in January.  Neysa Bonito will notify the patient. Wendall Mola CMA

## 2011-09-03 NOTE — Telephone Encounter (Signed)
[name] , RN called [phone]) asking if Vanco was to stop on 09/07/11 & if they were to pull PICC at that time. Also pt wanted to know if she is to start the Cleocin at that time. Notes from Office Visit also states that pt is to start Bactrim when Vanco is done. Told Christy I will check with md this pm & call her with response

## 2011-09-03 NOTE — Telephone Encounter (Signed)
Please see note to [facility] regarding this.  I have asked her to continue the IV vancomycin for another 2 weeks based on the persistently elevated ESR and CRP.  This is simply to err on the side of caution.  Then she can stop the vanco and start Cleocin for a few weeks, until her appt in January.  Thanks

## 2011-09-07 ENCOUNTER — Telehealth: Payer: Self-pay | Admitting: Licensed Clinical Social Worker

## 2011-09-07 NOTE — Telephone Encounter (Signed)
Nurse from [facility] called to get prn cathflow orders because the patients picc line is not infusing the medication properly. Orders were given.

## 2011-09-11 ENCOUNTER — Telehealth: Payer: Self-pay | Admitting: *Deleted

## 2011-09-11 NOTE — Telephone Encounter (Signed)
I spoke with the rn again. States she was told to give 2 more weeks on 09/03/11, a  Monday. The med should run thru Monday the 10th if she gets it 7 days a week. Not the 7th. To md to confirm this

## 2011-09-11 NOTE — Telephone Encounter (Signed)
Shaunice with [facility] called stating pt's last day of antibiotics was this Friday & wants an order to pull PICC. [phone]. Told her I will check with md & call her

## 2011-09-11 NOTE — Telephone Encounter (Signed)
That sounds good, thanks for checking on that.  Continue through the 10th, pull the PICC and she can start the clindamycin until she sees me in January.  Also, with the next lab draw, have them do an ESR and CRP, if not already ordered.  Thanks.

## 2011-09-11 NOTE — Telephone Encounter (Signed)
That doesn't seem right, she was going to continue an extra 2 weeks that I thought would be through mid-December.  See previous phone note.  Though maybe I have the dates wrong and this is the end of the extra 2 weeks?

## 2011-09-11 NOTE — Telephone Encounter (Signed)
I spoke with the rn & gave her the orders. She repeated them back and will do it

## 2011-09-17 ENCOUNTER — Encounter: Payer: Self-pay | Admitting: Internal Medicine

## 2011-09-19 ENCOUNTER — Telehealth: Payer: Self-pay | Admitting: *Deleted

## 2011-09-19 NOTE — Telephone Encounter (Signed)
Pt called this afternoon in regards to her symptoms since starting her oral antibiotics. Pt had PICC pulled 09/17/11 and started taking clindamycin 09/18/11 and today she has developed bloating/nausea/ and "knotted" stomach. Denies diarrhea. Will notify Dr. Luciana Axe for order clarification. Tacey Heap RN

## 2011-09-20 ENCOUNTER — Telehealth: Payer: Self-pay | Admitting: Licensed Clinical Social Worker

## 2011-09-20 ENCOUNTER — Other Ambulatory Visit: Payer: Self-pay | Admitting: Licensed Clinical Social Worker

## 2011-09-20 DIAGNOSIS — M4640 Discitis, unspecified, site unspecified: Secondary | ICD-10-CM

## 2011-09-20 MED ORDER — DOXYCYCLINE HYCLATE 100 MG PO TABS: 100.0000 mg | ORAL_TABLET | Freq: Two times a day (BID) | ORAL | Status: AC

## 2011-09-20 MED ORDER — DOXYCYCLINE MONOHYDRATE 100 MG PO CAPS: 100.0000 mg | ORAL_CAPSULE | Freq: Two times a day (BID) | ORAL | Status: DC

## 2011-09-20 NOTE — Telephone Encounter (Signed)
Ok, Doxycycline 100 mg twice a day.

## 2011-09-20 NOTE — Telephone Encounter (Signed)
Patient called stating that the Clindamycin that she started 2 days ago is causing nausea and bloating she does not want to stay on this medication. She would like Dr. Luciana Axe to prescribe something else.

## 2011-10-23 ENCOUNTER — Ambulatory Visit (INDEPENDENT_AMBULATORY_CARE_PROVIDER_SITE_OTHER): Payer: Medicare PPO | Admitting: Internal Medicine

## 2011-10-23 ENCOUNTER — Encounter: Payer: Self-pay | Admitting: Internal Medicine

## 2011-10-23 VITALS — BP 132/81 | HR 87 | Temp 97.7°F | Ht <= 58 in | Wt 105.0 lb

## 2011-10-23 DIAGNOSIS — M4640 Discitis, unspecified, site unspecified: Secondary | ICD-10-CM

## 2011-10-23 DIAGNOSIS — M519 Unspecified thoracic, thoracolumbar and lumbosacral intervertebral disc disorder: Secondary | ICD-10-CM

## 2011-10-23 NOTE — Progress Notes (Signed)
  Subjective:    Patient ID: [name], female    DOB: [birth year], 71 y.o.   MRN: [id number]  HPI she comes in for follow up of her vertebral MRSA infection.  She completed a course of IV vancomycin (did not tolerate rifampin), which was extended 2 weeks due to persistent drainage.  The drainage had resolved and she was switched to po doxycyline (after not tolerating clindamycin) for an extra month with concern by me that it had not completely healed.  She comes in now off antibiotics.  She had recently seen Dr. Jeral Fruit who is pleased with the progress and now other interventions needed.     Review of Systems  Constitutional: Negative for fever, chills and activity change.  Gastrointestinal: Negative for diarrhea.  Genitourinary: Negative for urgency and enuresis.  Musculoskeletal: Negative for myalgias, back pain and arthralgias.  Skin: Negative for pallor and rash.  Neurological: Negative for dizziness and headaches.  Hematological: Negative for adenopathy.  Psychiatric/Behavioral: Negative for dysphoric mood. The patient is not nervous/anxious.        Objective:   Physical Exam  Constitutional: She appears well-developed and well-nourished. No distress.  Cardiovascular: Normal rate, regular rhythm and normal heart sounds.  Exam reveals no gallop and no friction rub.   No murmur heard. Pulmonary/Chest: Effort normal and breath sounds normal. No respiratory distress. She has no wheezes. She has no rales.  Abdominal: Soft. Bowel sounds are normal. She exhibits no distension. There is no tenderness.  Musculoskeletal:       Non tender over spinous processes  Skin: Skin is warm and dry. No rash noted.  Psychiatric: She has a normal mood and affect. Her behavior is normal.          Assessment & Plan:

## 2011-10-23 NOTE — Assessment & Plan Note (Signed)
She is doing well and off antibiotics.  I do not see a need to continue with good clinical resolution.  She can follow up with me PRN.

## 2012-10-03 IMAGING — CT CT L SPINE W/ CM
4 of 11 series · 12 of 33 positions shown, 13 images · IV contrast (omnipaque)
Comparison: CT myelogram 04/14/2008 (preop).

MYELOGRAM INJECTION
TECHNIQUE: Informed consent was obtained from the patient prior to
the procedure, including potential complications of headache,
allergy, infection and pain. Specific instructions were given
regarding 24 hour bedrest post procedure to prevent post-LP
headache.  A timeout procedure was performed.  With the patient
prone, the lower back was prepped with Betadine.  1% Lidocaine was
used for local anesthesia.  Lumbar puncture was performed by the
radiologist at the  L3 level using a 22 gauge needle with return of
clear CSF.  15 cc of Omnipaque 180 was injected into the
subarachnoid space .
CLINICAL DATA: Severe low back pain.  Pain radiates down the legs,
right greater than left.
TECHNIQUE: Multidetector CT imaging of the lumbar spine was
performed following myelography.  Multiplanar CT image
reconstructions were also generated.

[Series 2: l spine bone · axial · 0.27mm/px · z∈[+8,+68]mm · 2 of 73 slices shown, 3 images]
[im 25/73  soft-tissue]
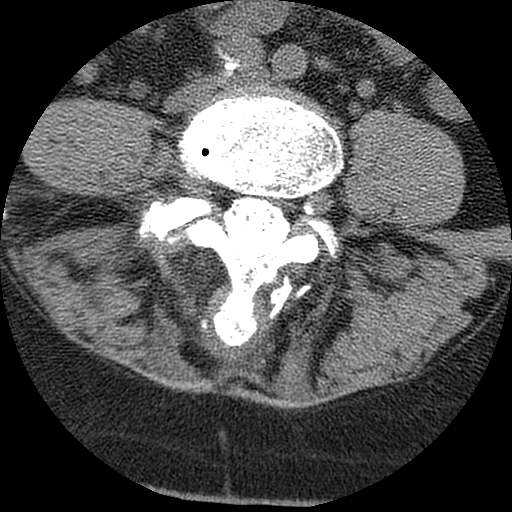
[im 25/73  bone]
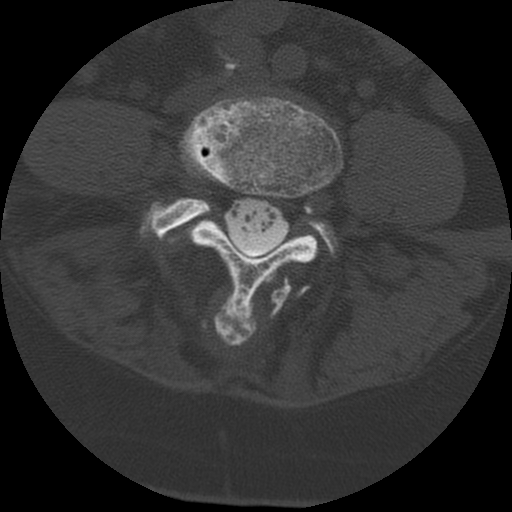
[im 49/73  bone]
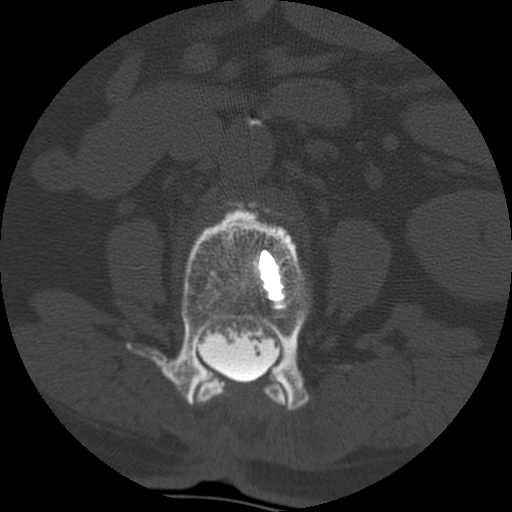

[Series 3: l spine soft · axial · 0.27mm/px · z∈[+8,+68]mm · 2 of 73 slices shown]
[im 25/73  soft-tissue]
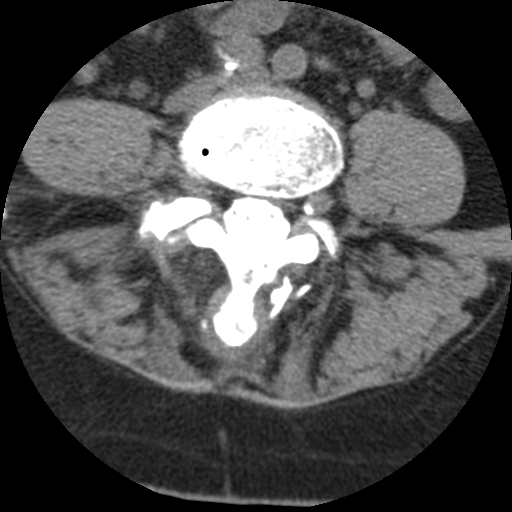
[im 49/73  soft-tissue]
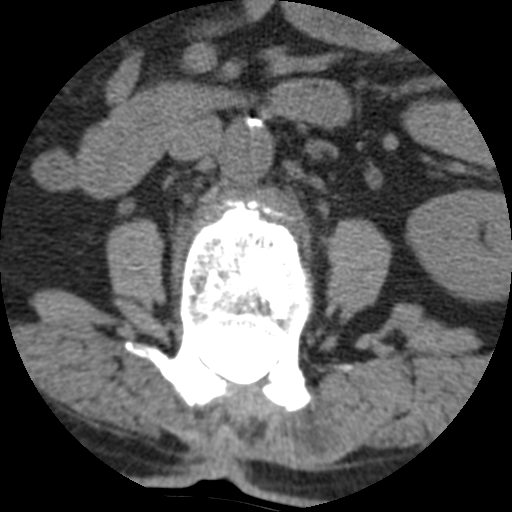

[Series 400: cor #1 · coronal · 0.36mm/px · 5 of 50 slices shown]
[im 9/50  bone]
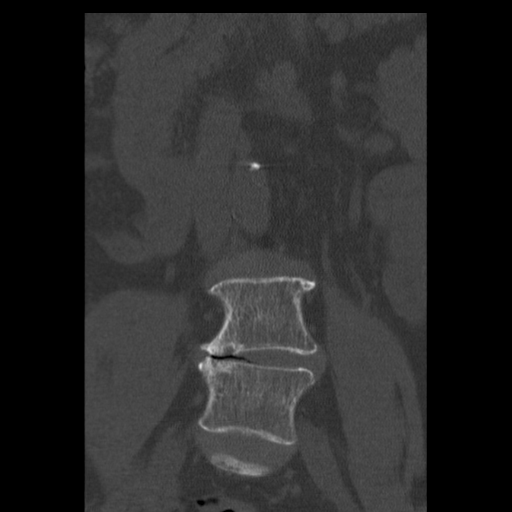
[im 17/50  bone]
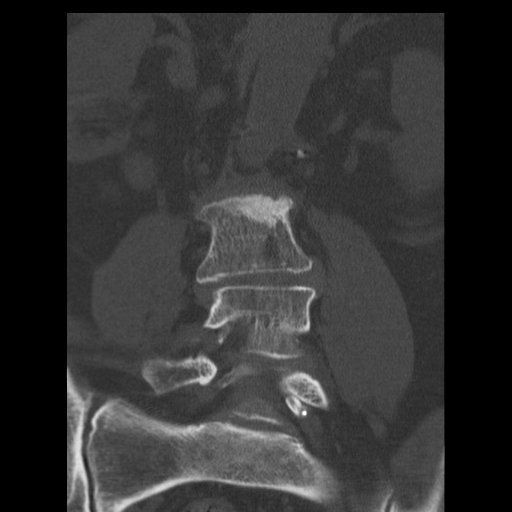
[im 25/50  bone]
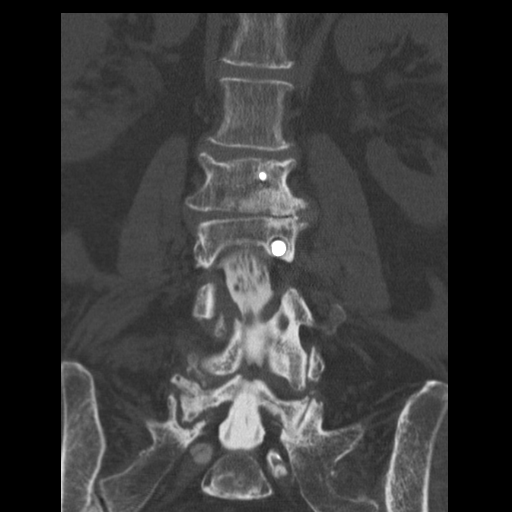
[im 33/50  bone]
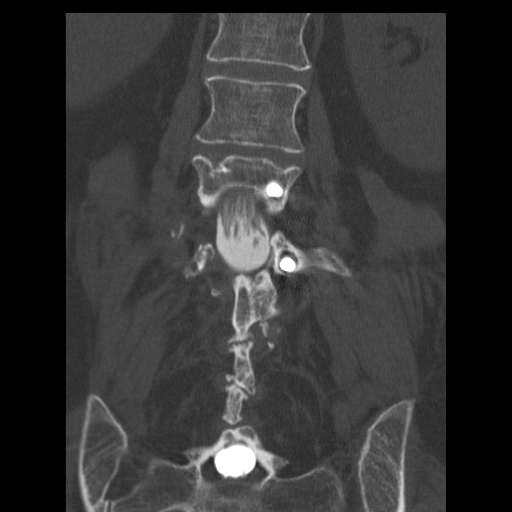
[im 41/50  bone]
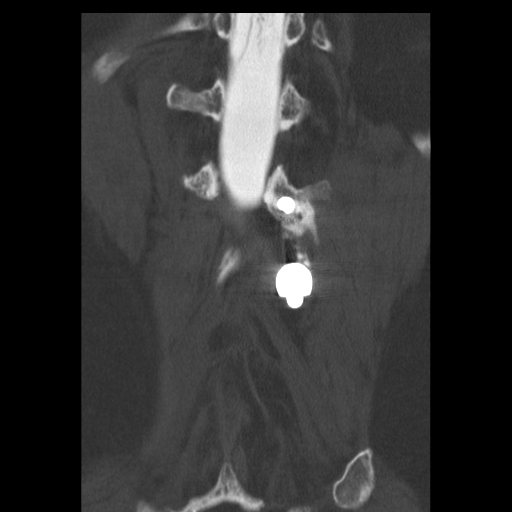

[Series 401: cor #2 · coronal · 0.36mm/px · 3 of 47 slices shown]
[im 10/47  bone]
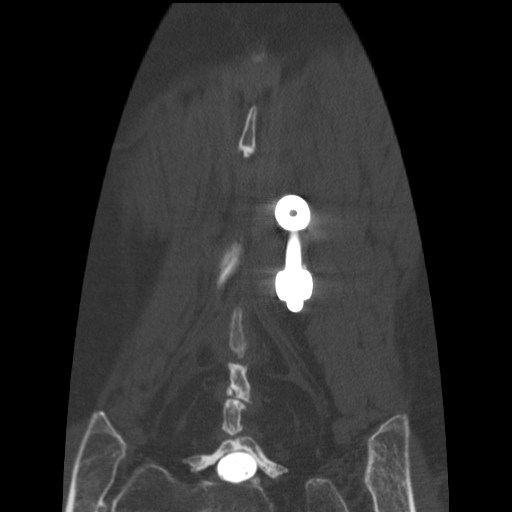
[im 19/47  bone]
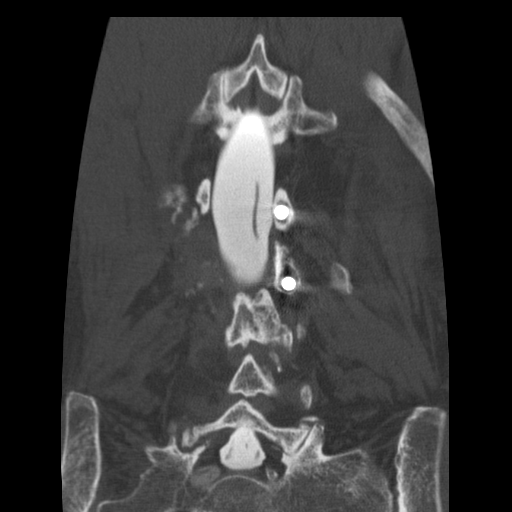
[im 28/47  bone]
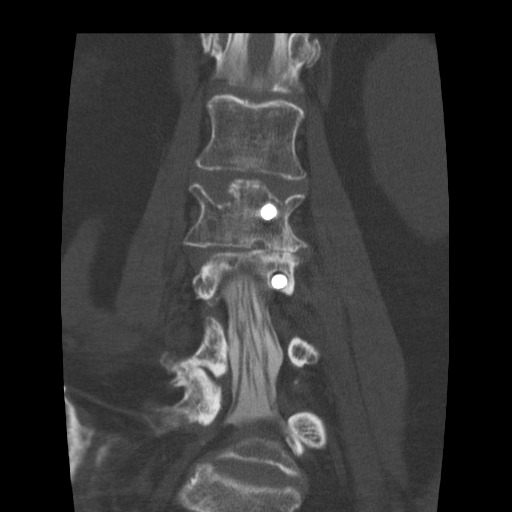

[12 of 33 positions shown; findings below may reference images not displayed]

IMPRESSION: Successful injection of  intrathecal contrast for myelography.

MYELOGRAM LUMBAR
FINDINGS: Good opacification lumbar subarachnoid space.  There is
disc space narrowing at L4-5 without L5 nerve root cut off.  There
is disc space narrowing L3-4 with mild retrolisthesis of 3 mm
associated with posterior element hypertrophy.  This results in
mild stenosis.  At L2-3  there is 5 mm retrolisthesis.  This
increases to 7 mm in extension but does not significantly change in
flexion.  There is loosening of the left L3 screw.  There is no
posterolateral or interval fusion.  There is no significant
stenosis at this level.  The L2 vertebral body is mildly wedged,
representing a chronic compression fracture.  There is mild annular
bulging L1-2.

Fluoroscopy Time: 1.25 minutes
IMPRESSION: As above

CT MYELOGRAPHY LUMBAR SPINE
FINDINGS: No prevertebral or paraspinous masses.  Nonaneurysmal
atherosclerotic calcification of the aorta is present.

L1-2: Central bulge.  No compressive lesion.

L2-3: 5 mm retrolisthesis with the patient recumbent.  No
posterolateral or  interbody fusion.  Loosening of the left L3
screw.  Severe disc space narrowing and vacuum disc phenomenon,
worse on the left.  Right L2-3 facet is surgically absent.  Wide
posterior decompression.  No L3 nerve root encroachment in the
canal.  Left L2 nerve root encroachment is possible in the foramen.

L3-4: 3 mm retrolisthesis with posterior element hypertrophy.  Mild
stenosis.  Shallow central protrusion.  No definite L4 nerve root
encroachment however

L4-5: Mild annular bulging.  Mild facet arthropathy.  Moderate disc
space narrowing.  Minimal retrolisthesis.  Asymmetric foraminal
narrowing on the right could affect the L4 nerve root.

L5-S1: Mild facet arthropathy.  No stenosis or disc protrusion.

Unusual calcification of the peripheral nerve root sleeves in the
foramen at L5  and S1 was present on prior CT myelogram.  This
appears nonpathologic.
IMPRESSION: Nonunion at L2-3.  Vacuum disc phenomenon and loosening of the left
L3 screw.

Dynamic instability ranging between 5 and 7 mm.

Mild adjacent segment disease at L3-4 with facet arthropathy and
retrolisthesis along with shallow disc protrusion.  No definite L4
nerve root encroachment however.

Mild bulge L1-L2.

Asymmetric loss of interspace height at L4-5 on the right may be
compensatory.  No L5 nerve root encroachment canal but right L4
nerve root encroachment foramen is possible.

## 2012-11-14 ENCOUNTER — Other Ambulatory Visit: Payer: Self-pay | Admitting: Neurosurgery

## 2012-11-14 DIAGNOSIS — M549 Dorsalgia, unspecified: Secondary | ICD-10-CM

## 2012-12-01 ENCOUNTER — Ambulatory Visit
Admission: RE | Admit: 2012-12-01 | Discharge: 2012-12-01 | Disposition: A | Discharge status: Home / Self Care | Payer: Medicare Other | Source: Ambulatory Visit | Attending: Anesthesiology | Admitting: Anesthesiology

## 2012-12-01 VITALS — BP 124/65 | HR 64

## 2012-12-01 DIAGNOSIS — M549 Dorsalgia, unspecified: Secondary | ICD-10-CM

## 2012-12-01 MED ORDER — DIAZEPAM 5 MG PO TABS: 5.0000 mg | ORAL_TABLET | Freq: Once | ORAL | Status: DC

## 2012-12-01 MED ORDER — IOHEXOL 180 MG/ML  SOLN: 15.0000 mL | Freq: Once | INTRAMUSCULAR | Status: AC | PRN

## 2013-05-03 IMAGING — CR DG CHEST 2V
2 series · 2 of 2 positions shown · non-contrast
Comparison: Two-view chest x-ray 04/30/2008.

CLINICAL DATA: Preoperative respiratory evaluation prior lumbar
laminectomy.  History of hypertension.

CHEST - 2 VIEW 07/06/2011:

[view not recorded (1 of 2)]
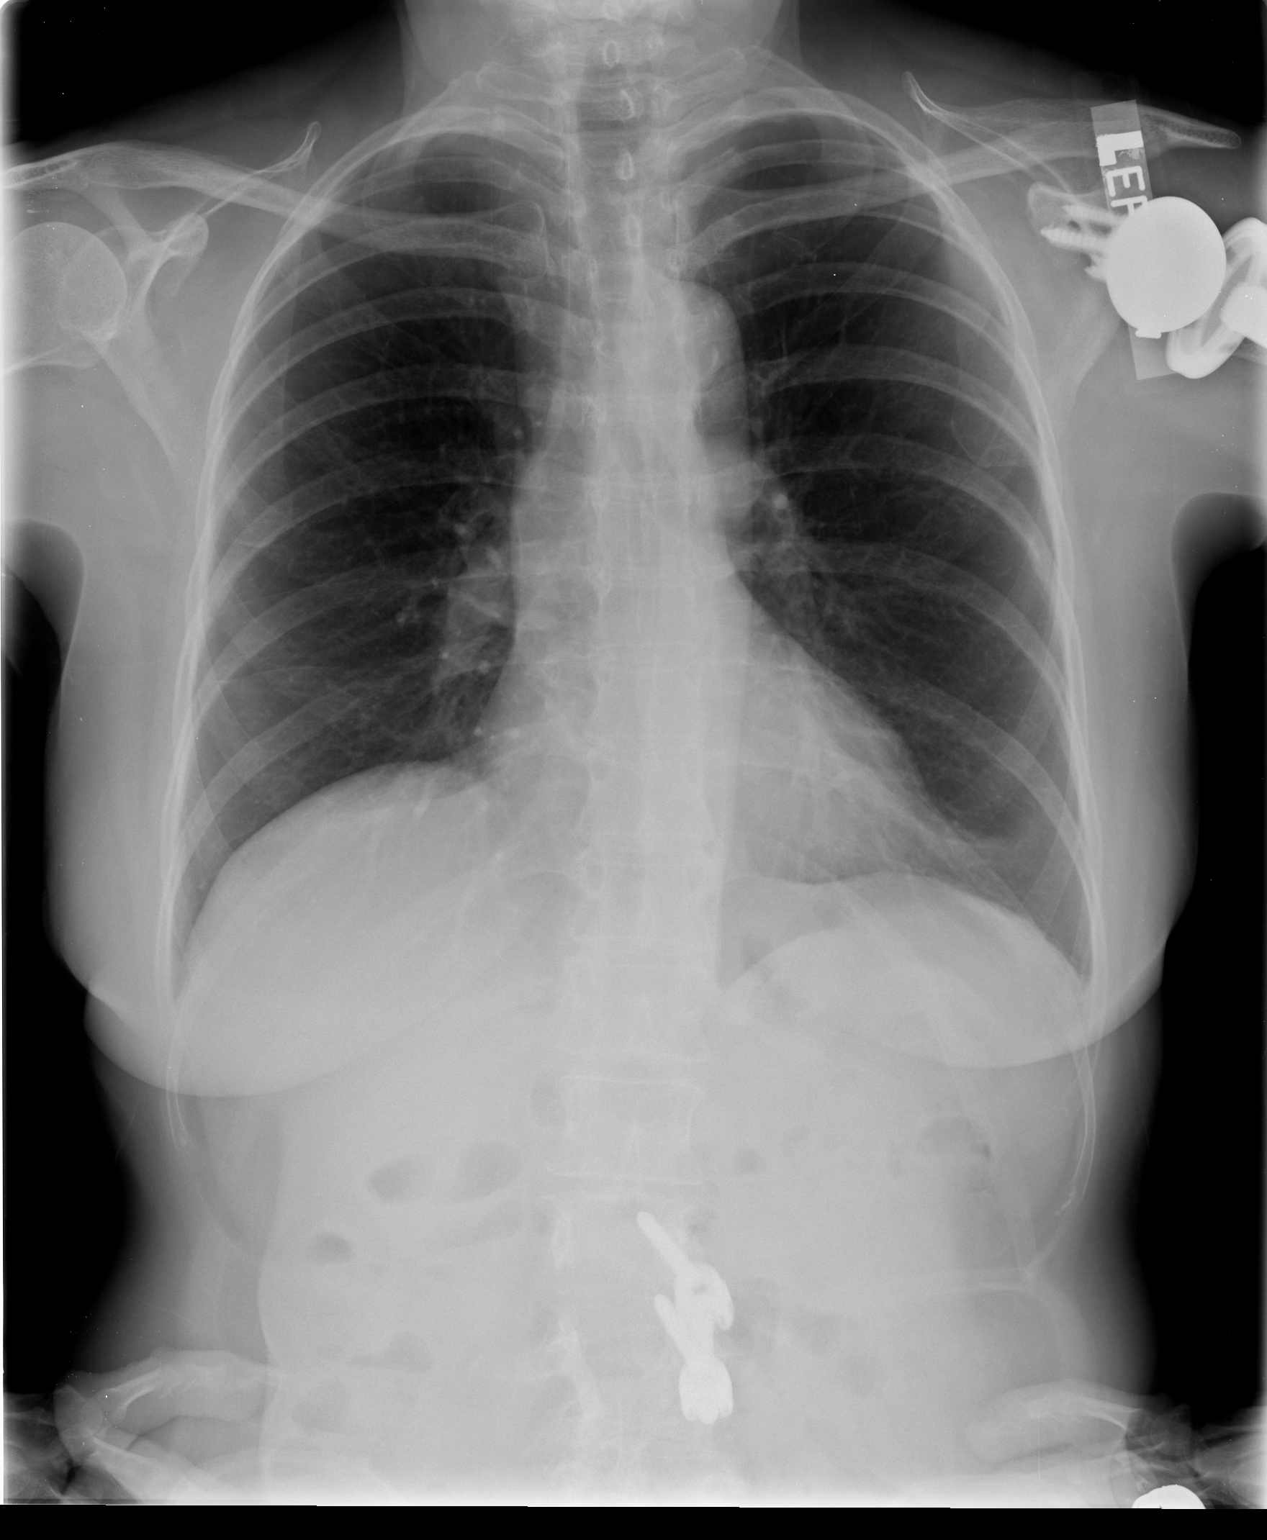

[view not recorded (2 of 2)]
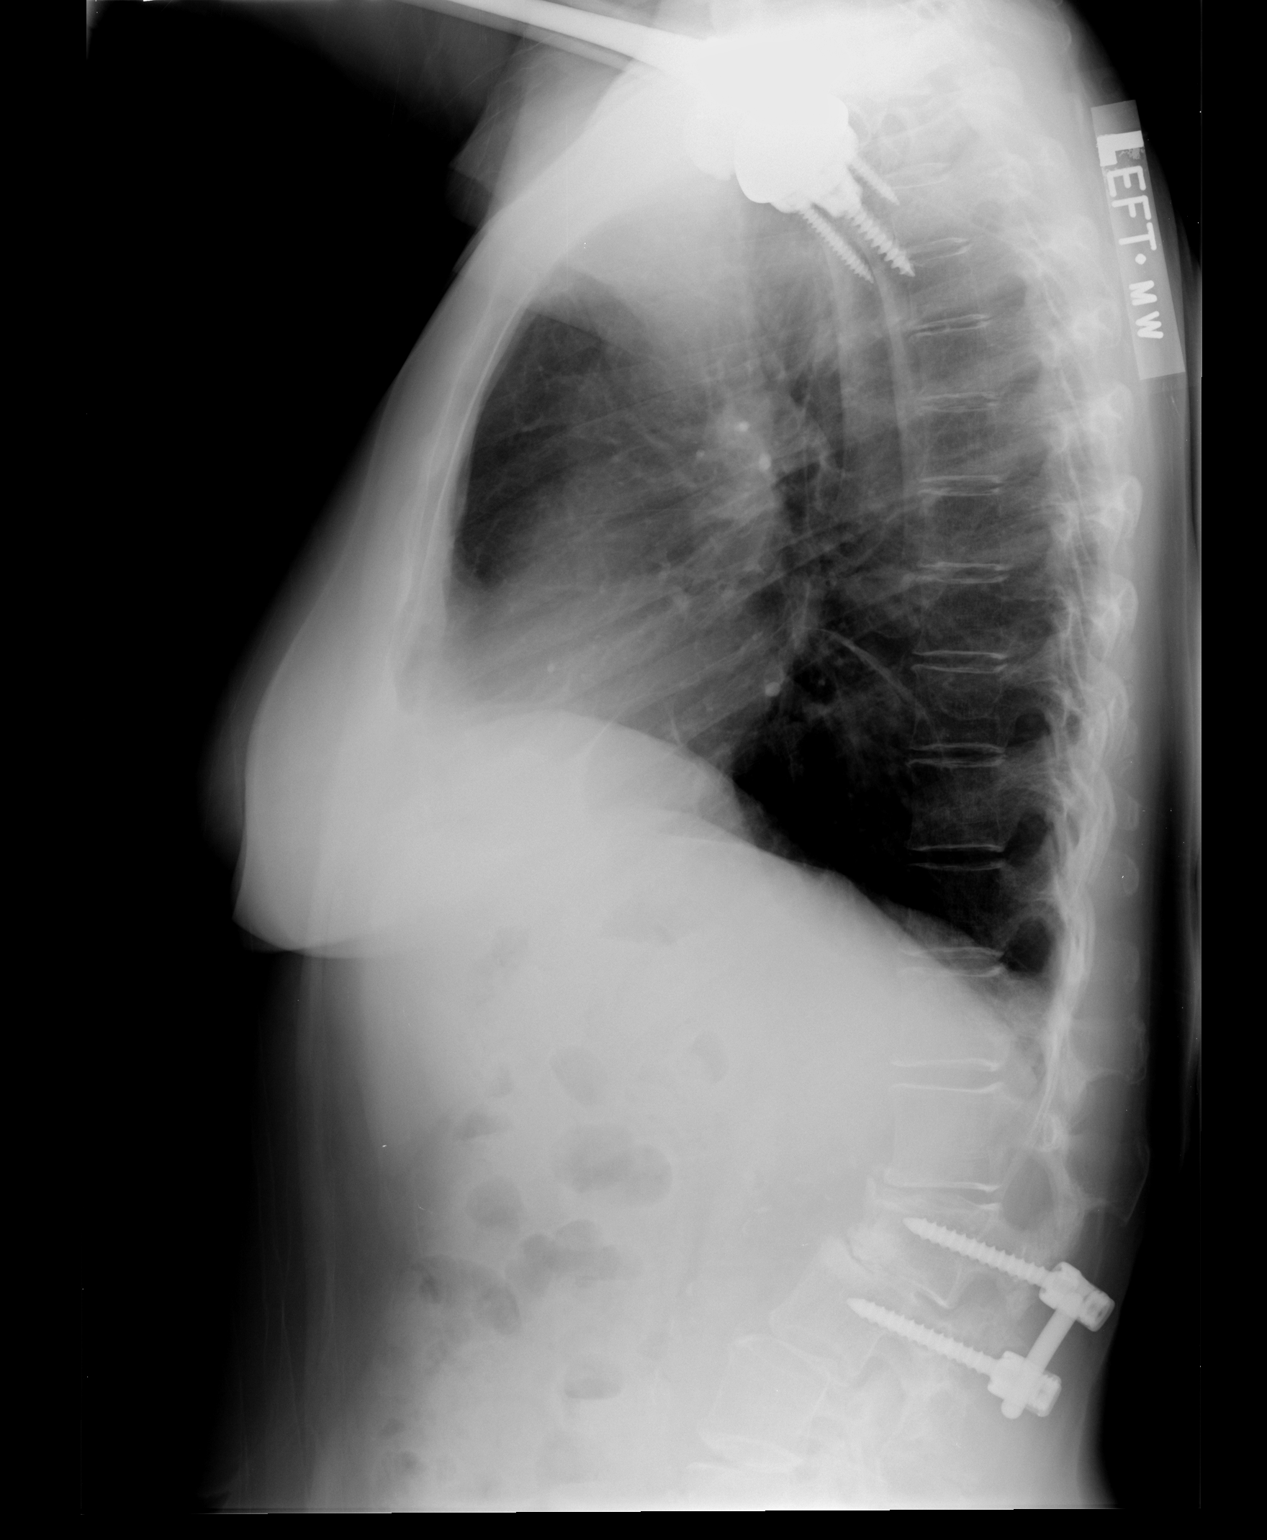

[2 of 2 positions shown; findings below may reference images not displayed]

FINDINGS: Cardiac silhouette normal in size, unchanged.  Thoracic
aorta mildly atherosclerotic, unchanged.  Hilar and mediastinal
contours otherwise unremarkable. New mild linear atelectasis or
scarring in the left lower lobe.  Lungs otherwise clear.
Bronchovascular markings normal.  No pleural effusions.  No
pneumothorax.  Prior unilateral left L2-3 PLIF and prior left
shoulder arthroplasty.
IMPRESSION: Mild linear atelectasis or scarring in the left lower lobe.  No
acute cardiopulmonary disease otherwise.

## 2014-01-05 ENCOUNTER — Other Ambulatory Visit: Payer: Self-pay | Admitting: Neurosurgery

## 2014-01-05 DIAGNOSIS — M5416 Radiculopathy, lumbar region: Secondary | ICD-10-CM

## 2014-02-16 ENCOUNTER — Other Ambulatory Visit: Payer: Medicare Other

## 2014-02-22 ENCOUNTER — Ambulatory Visit
Admission: RE | Admit: 2014-02-22 | Discharge: 2014-02-22 | Disposition: A | Discharge status: Home / Self Care | Payer: Medicare Other | Source: Ambulatory Visit | Attending: Neurosurgery | Admitting: Neurosurgery

## 2014-02-22 VITALS — BP 113/50 | HR 69

## 2014-02-22 DIAGNOSIS — M5416 Radiculopathy, lumbar region: Secondary | ICD-10-CM

## 2014-02-22 DIAGNOSIS — M4640 Discitis, unspecified, site unspecified: Secondary | ICD-10-CM

## 2014-02-22 MED ORDER — DIAZEPAM 5 MG PO TABS
5.0000 mg | ORAL_TABLET | Freq: Once | ORAL | Status: AC
  Administered 2014-02-22: 5 mg via ORAL

## 2014-02-22 MED ORDER — IOHEXOL 180 MG/ML  SOLN
15.0000 mL | Freq: Once | INTRAMUSCULAR | Status: AC | PRN
  Administered 2014-02-22: 15 mL via INTRATHECAL

## 2014-02-22 NOTE — Progress Notes (Signed)
Pt states she has come off as prestiq for at least the past two days.

## 2014-02-22 NOTE — Discharge Instructions (Signed)
Myelogram Discharge Instructions  1. Go home and rest quietly for the next 24 hours.  It is important to lie flat for the next 24 hours.  Get up only to go to the restroom.  You may lie in the bed or on a couch on your back, your stomach, your left side or your right side.  You may have one pillow under your head.  You may have pillows between your knees while you are on your side or under your knees while you are on your back.  2. DO NOT drive today.  Recline the seat as far back as it will go, while still wearing your seat belt, on the way home.  3. You may get up to go to the bathroom as needed.  You may sit up for 10 minutes to eat.  You may resume your normal diet and medications unless otherwise indicated.  Drink lots of extra fluids today and tomorrow.  4. The incidence of headache, nausea, or vomiting is about 5% (one in 20 patients).  If you develop a headache, lie flat and drink plenty of fluids until the headache goes away.  Caffeinated beverages may be helpful.  If you develop severe nausea and vomiting or a headache that does not go away with flat bed rest, call [phone].  5. You may resume normal activities after your 24 hours of bed rest is over; however, do not exert yourself strongly or do any heavy lifting tomorrow. If when you get up you have a headache when standing, go back to bed and force fluids for another 24 hours.  6. Call your physician for a follow-up appointment.  The results of your myelogram will be sent directly to your physician by the following day.  7. If you have any questions or if complications develop after you arrive home, please call [phone].  Discharge instructions have been explained to the patient.  The patient, or the person responsible for the patient, fully understands these instructions.      May resume [place] on Feb 23, 2014, after 9:30 am.

## 2015-05-24 ENCOUNTER — Encounter: Payer: Self-pay | Admitting: *Deleted

## 2015-06-14 ENCOUNTER — Encounter: Payer: Self-pay | Admitting: Cardiovascular Disease

## 2020-07-25 ENCOUNTER — Ambulatory Visit: Payer: Medicare Other | Attending: Nurse Practitioner | Admitting: Physical Therapy

## 2020-07-25 ENCOUNTER — Other Ambulatory Visit: Payer: Self-pay

## 2020-07-25 ENCOUNTER — Encounter: Payer: Self-pay | Admitting: Physical Therapy

## 2020-07-25 DIAGNOSIS — R293 Abnormal posture: Secondary | ICD-10-CM

## 2020-07-25 DIAGNOSIS — M542 Cervicalgia: Secondary | ICD-10-CM | POA: Insufficient documentation

## 2020-07-25 NOTE — Therapy (Addendum)
[facility] [address] [place], [place], [address] Phone: [phone]   Fax:  [phone]  Physical Therapy Evaluation  Patient Details  Name: [name] MRN: [id number] Date of Birth: [birth year] Referring Provider (PT): Buel Ream FNP.   Encounter Date: 07/25/2020   PT End of Session - 07/25/20 1324    Visit Number 1    Number of Visits 12    Date for PT Re-Evaluation 09/05/20    PT Start Time 1258    PT Stop Time 1343    PT Time Calculation (min) 45 min    Activity Tolerance Patient tolerated treatment well    Behavior During Therapy [facility] for tasks assessed/performed           Past Medical History:  Diagnosis Date  . Arthritis   . Hypertension   . Osteoporosis   . Thyroid disease     Past Surgical History:  Procedure Laterality Date  . Bilateral TSA's    . FRACTURE SURGERY    . Right TKA    . SPINE SURGERY      There were no vitals filed for this visit.    Subjective Assessment - 07/25/20 1328    Subjective COVID-19 screen performed prior to patient entering clinic.  The patient presents to the clinic with c/o neck and right shoulder pain that she states has been ongoing and worsening over the last few months.  Her pain is rated at an 8/10 today and this includes headaches.  Pain medication sand heat decreases her pain and increased movement of her right shoulder increases her pain.    Pertinent History Right TSA (12/22/19), left TSA, Right TKA, ORIF of radius, HTN, OA, OP, spinal surgery    Patient Stated Goals Reduce pain and eliminate headaches.    Currently in Pain? Yes    Pain Score 8     Pain Location Neck    Pain Descriptors / Indicators Aching;Sharp;Throbbing    Pain Type Acute pain    Pain Onset More than a month ago    Pain Frequency Constant    Aggravating Factors  See above.    Pain Relieving Factors See above.              [facility] PT Assessment - 07/25/20 0001       Assessment   Medical Diagnosis Cervical radiculopathy.    Referring Provider (PT) Buel Ream FNP.    Onset Date/Surgical Date --   "Few months."     Precautions   Precaution Comments OP.      Restrictions   Weight Bearing Restrictions No      Balance Screen   Has the patient fallen in the past 6 months No    Has the patient had a decrease in activity level because of a fear of falling?  Yes    Is the patient reluctant to leave their home because of a fear of falling?  No      Home Environment   Living Environment Private residence      Prior Function   Level of Independence Independent      Observation/Other Assessments   Focus on Therapeutic Outcomes (FOTO)  69% limitation.      Posture/Postural Control   Posture/Postural Control Postural limitations    Postural Limitations Rounded Shoulders;Forward head    Posture Comments Right "popeye" muscle.      ROM / Strength   AROM / PROM / Strength AROM;Strength  AROM   Overall AROM Comments WFL bilateral UE's.  Right active cervical rotation=35 degrees and active left rotation= 55 degrees.      Strength   Overall Strength Comments Bilateral UE antigravity movements.      Palpation   Palpation comment Tender to palpation over right subocciptal region, right UT and medial scapular border.      Ambulation/Gait   Gait Comments WNL.                      Objective measurements completed on examination: See above findings.       [facility] Adult PT Treatment/Exercise - 07/25/20 0001      Modalities   Modalities Electrical Stimulation;Moist Heat      Moist Heat Therapy   Number Minutes Moist Heat 20 Minutes    Moist Heat Location --   Left cervical.     Electrical Stimulation   Electrical Stimulation Location Left cervical/scapular region.    Electrical Stimulation Action IFC    Electrical Stimulation Parameters 80-150 Hz x 20 minutes at 100% scan.    Electrical Stimulation Goals Pain           RT CERVICAL.             PT Long Term Goals - 07/25/20 1428      PT LONG TERM GOAL #1   Title Independent with a HEP.    Time 6    Period Weeks    Status New      PT LONG TERM GOAL #2   Title Reduce headaches to no more than 2 per week.    Time 6    Period Weeks    Status New      PT LONG TERM GOAL #3   Title Increase active cervical rotation to 60 degrees+ so patient can turn head more easily while driving.    Time 6    Period Weeks    Status New      PT LONG TERM GOAL #4   Title Perform ADL's with pain not > 3-4/10.    Time 6    Period Weeks    Status New                  Plan - 07/25/20 1353    Clinical Impression Statement The patient presents to [facility] with c/o right sided neck pain that has been ongoing for the last few months.  She tender to palpation over her right suboccipital region, right UT and scapular region.  She has daily headaches.  She has a significant loss of cervical range of motion.  Patient will benefit from skilled physical therapy intervention to address deficits and pain.    Personal Factors and Comorbidities Comorbidity 1;Comorbidity 2;Comorbidity 3+    Comorbidities Right TSA (12/22/19), left TSA, Right TKA, ORIF of radius, HTN, OA, OP, spinal surgery    Examination-Activity Limitations Other    Examination-Participation Restrictions Other    Stability/Clinical Decision Making Evolving/Moderate complexity    Clinical Decision Making Moderate    PT Frequency 2x / week    PT Duration 6 weeks    PT Treatment/Interventions ADLs/Self Care Home Management;Cryotherapy;Electrical Stimulation;Ultrasound;Moist Heat;Therapeutic activities;Manual techniques;Patient/family education;Dry needling;Passive range of motion    PT Next Visit Plan STW/M and combo e'stim/US to affected right cervical/scapular region, chin tucks and cervical extension, active cervical range of motion and scapular exercises.    Consulted and Agree with Plan of Care  Patient  Patient will benefit from skilled therapeutic intervention in order to improve the following deficits and impairments:  Pain, Impaired tone, Postural dysfunction, Decreased activity tolerance, Decreased range of motion  Visit Diagnosis: Cervicalgia - Plan: PT plan of care cert/re-cert  Abnormal posture - Plan: PT plan of care cert/re-cert     Problem List Patient Active Problem List   Diagnosis Date Noted  . Diskitis 08/20/2011    Anabela Crayton, [place] MPT 07/25/2020, 2:31 PM  [facility] [address] [place], [place], [address] Phone: [phone]   Fax:  [phone]  Name: [name] MRN: [id number] Date of Birth: 05-17-1941

## 2020-07-26 ENCOUNTER — Ambulatory Visit: Payer: Medicare Other | Admitting: Physical Therapy

## 2020-07-26 DIAGNOSIS — M542 Cervicalgia: Secondary | ICD-10-CM

## 2020-07-26 DIAGNOSIS — R293 Abnormal posture: Secondary | ICD-10-CM

## 2020-07-26 NOTE — Therapy (Signed)
[facility] [address] [place], [place], [address] Phone: [phone]   Fax:  [phone]  Physical Therapy Treatment  Patient Details  Name: [name] MRN: [id number] Date of Birth: 1941-07-21 Referring Provider (PT): Buel Ream FNP.   Encounter Date: 07/26/2020   PT End of Session - 07/26/20 1543    Visit Number 2    Number of Visits 12    Date for PT Re-Evaluation 09/05/20    PT Start Time 0230    PT Stop Time 0322    PT Time Calculation (min) 52 min    Activity Tolerance Patient tolerated treatment well    Behavior During Therapy [facility] for tasks assessed/performed           Past Medical History:  Diagnosis Date  . Arthritis   . Hypertension   . Osteoporosis   . Thyroid disease     Past Surgical History:  Procedure Laterality Date  . Bilateral TSA's    . FRACTURE SURGERY    . Right TKA    . SPINE SURGERY      There were no vitals filed for this visit.   Subjective Assessment - 07/26/20 1542    Subjective COVID-19 screen performed prior to patient entering clinic.  Felt better after treatment yesterday but pain came back.    Pertinent History Right TSA (12/22/19), left TSA, Right TKA, ORIF of radius, HTN, OA, OP, spinal surgery    Patient Stated Goals Reduce pain and eliminate headaches.    Currently in Pain? Yes    Pain Score 7     Pain Location Neck    Pain Orientation Right    Pain Descriptors / Indicators Sharp;Throbbing    Pain Type Acute pain    Pain Onset More than a month ago                             [facility] Adult PT Treatment/Exercise - 07/26/20 0001      Modalities   Modalities Electrical Stimulation;Moist Heat;Ultrasound      Moist Heat Therapy   Number Minutes Moist Heat 20 Minutes    Moist Heat Location --   RT CERVICAL.     Programme researcher, broadcasting/film/video Location RT cervical/scap region.    Electrical Stimulation Action IFC     Electrical Stimulation Parameters 80-150 Hz x 20 minutes.    Electrical Stimulation Goals Pain      Ultrasound   Ultrasound Location Right cervical/UT muscle belly.    Ultrasound Parameters Combo e'stim/US at 1.50 W/CM2 x 12 minutes.      Manual Therapy   Manual Therapy Soft tissue mobilization    Soft tissue mobilization STW/M x 11 minutes to patient's right suboccipital, cervical paraspinal and UT region to reduce tone.                       PT Long Term Goals - 07/25/20 1428      PT LONG TERM GOAL #1   Title Independent with a HEP.    Time 6    Period Weeks    Status New      PT LONG TERM GOAL #2   Title Reduce headaches to no more than 2 per week.    Time 6    Period Weeks    Status New      PT LONG TERM GOAL #3   Title Increase active  cervical rotation to 60 degrees+ so patient can turn head more easily while driving.    Time 6    Period Weeks    Status New      PT LONG TERM GOAL #4   Title Perform ADL's with pain not > 3-4/10.    Time 6    Period Weeks    Status New                 Plan - 07/26/20 1600    Clinical Impression Statement Patient did well with treatment today.  She was most tender today in the region of her right suboccipital area.  She responded well to STW/M.    Personal Factors and Comorbidities Comorbidity 1;Comorbidity 2;Comorbidity 3+    Comorbidities Right TSA (12/22/19), left TSA, Right TKA, ORIF of radius, HTN, OA, OP, spinal surgery    Examination-Activity Limitations Other    Examination-Participation Restrictions Other    Stability/Clinical Decision Making Evolving/Moderate complexity    PT Frequency 2x / week    PT Duration 6 weeks    PT Treatment/Interventions ADLs/Self Care Home Management;Cryotherapy;Electrical Stimulation;Ultrasound;Moist Heat;Therapeutic activities;Manual techniques;Patient/family education;Dry needling;Passive range of motion    PT Next Visit Plan STW/M and combo e'stim/US to affected right  cervical/scapular region, chin tucks and cervical extension, active cervical range of motion and scapular exercises.    Consulted and Agree with Plan of Care Patient           Patient will benefit from skilled therapeutic intervention in order to improve the following deficits and impairments:  Pain, Impaired tone, Postural dysfunction, Decreased activity tolerance, Decreased range of motion  Visit Diagnosis: Cervicalgia  Abnormal posture     Problem List Patient Active Problem List   Diagnosis Date Noted  . Diskitis 08/20/2011    Lennon Boutwell, [place] MPT 07/26/2020, 4:03 PM  [facility] [address] [place], [place], [address] Phone: [phone]   Fax:  [phone]  Name: [name] MRN: [id number] Date of Birth: 06/13/1941

## 2020-08-01 ENCOUNTER — Ambulatory Visit: Payer: Medicare Other | Admitting: Physical Therapy

## 2020-08-03 ENCOUNTER — Encounter: Payer: Medicare Other | Admitting: Physical Therapy

## 2020-08-08 ENCOUNTER — Ambulatory Visit: Payer: Medicare Other | Attending: Nurse Practitioner | Admitting: Physical Therapy

## 2020-08-09 ENCOUNTER — Ambulatory Visit: Payer: Medicare Other | Admitting: *Deleted

## 2020-08-11 ENCOUNTER — Ambulatory Visit: Payer: Medicare Other | Admitting: Physical Therapy

## 2021-06-06 ENCOUNTER — Ambulatory Visit: Payer: Medicare Other | Admitting: Physical Therapy

## 2021-06-08 ENCOUNTER — Ambulatory Visit: Payer: Medicare Other | Admitting: Physical Therapy

## 2024-08-10 ENCOUNTER — Ambulatory Visit: Admitting: Physical Therapy
